# Patient Record
Sex: Male | Born: 1960 | Race: Black or African American | Hispanic: No
Health system: Southern US, Community
[De-identification: ages and names within clinical notes are randomized; demographics above are authoritative.]

## PROBLEM LIST (undated history)

## (undated) DIAGNOSIS — I509 Heart failure, unspecified: Secondary | ICD-10-CM

## (undated) DIAGNOSIS — I10 Essential (primary) hypertension: Secondary | ICD-10-CM

## (undated) DIAGNOSIS — I219 Acute myocardial infarction, unspecified: Secondary | ICD-10-CM

## (undated) DIAGNOSIS — I2510 Atherosclerotic heart disease of native coronary artery without angina pectoris: Secondary | ICD-10-CM

---

## 2007-10-22 ENCOUNTER — Other Ambulatory Visit: Payer: Self-pay

## 2007-10-22 ENCOUNTER — Emergency Department: Payer: Self-pay | Admitting: Unknown Physician Specialty

## 2009-03-28 ENCOUNTER — Emergency Department: Payer: Self-pay | Admitting: Emergency Medicine

## 2010-02-25 ENCOUNTER — Emergency Department: Payer: Self-pay | Admitting: Unknown Physician Specialty

## 2014-01-13 ENCOUNTER — Emergency Department: Payer: Self-pay | Admitting: Emergency Medicine

## 2016-06-02 ENCOUNTER — Encounter: Payer: Self-pay | Admitting: Emergency Medicine

## 2016-06-02 ENCOUNTER — Emergency Department
Admission: EM | Admit: 2016-06-02 | Discharge: 2016-06-02 | Disposition: A | Discharge status: Home / Self Care | Payer: Self-pay | Attending: Emergency Medicine | Admitting: Emergency Medicine

## 2016-06-02 DIAGNOSIS — I10 Essential (primary) hypertension: Secondary | ICD-10-CM | POA: Insufficient documentation

## 2016-06-02 DIAGNOSIS — R51 Headache: Secondary | ICD-10-CM

## 2016-06-02 DIAGNOSIS — F17210 Nicotine dependence, cigarettes, uncomplicated: Secondary | ICD-10-CM | POA: Insufficient documentation

## 2016-06-02 DIAGNOSIS — R519 Headache, unspecified: Secondary | ICD-10-CM

## 2016-06-02 MED ORDER — HYDROCHLOROTHIAZIDE 12.5 MG PO CAPS: 12.5000 mg | ORAL_CAPSULE | Freq: Every day | ORAL | 0 refills | Status: DC

## 2016-06-02 NOTE — ED Provider Notes (Signed)
ED ECG REPORT I, [name], the attending physician, personally viewed and interpreted this ECG.  Sinus bradycardia, rate of 53, normal intervals, normal axis, no ST elevations or depressions.   [name], MD 06/02/16 [phone]

## 2016-06-02 NOTE — ED Provider Notes (Signed)
[facility] Emergency Department Provider Note  ____________________________________________  Time seen: Approximately 1:22 PM  I have reviewed the triage vital signs and the nursing notes.   HISTORY  Chief Complaint Headache    HPI Matthew Atkinson is a 55 y.o. male , NAD, presents to the emergency department via EMS for evaluation of headaches and hypertension. Patient states over the last 3 days he has had intermittent temporal headaches. States he has a family history of hypertension but his mother and father therefore he purchased a wrist blood pressure monitor and noted that his blood pressure was elevated. He also checked his blood pressure at a local [employer] blood pressure kiosk in it was also elevated. States he has overall been healthy throughout his life. Has not seen a medical provider for any physicals in the last 4 years. 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 Denies any visual changes, slurred speech or changes in gait. States that the headache is not the worst headache of his life. Headache was not a thunderclap presentation nor has awoken from sleep. Denies any sinus pressure or ear pressure. Has not had any chest pain, palpitations, shortness of breath, abdominal pain, nausea, vomiting, diaphoresis. Denies any numbness, weakness, tingling.   History reviewed. No pertinent past medical history.  There are no active problems to display for this patient.   History reviewed. No pertinent surgical history.  Prior to Admission medications   Medication Sig Start Date End Date Taking? Authorizing Provider  hydrochlorothiazide (MICROZIDE) 12.5 MG capsule Take 1 capsule (12.5 mg total) by mouth daily. 06/02/16 07/02/16  Jami L Hagler, PA-C    Allergies Patient has no known allergies.  History reviewed. No pertinent family history.  Social History Social  History  Substance Use Topics  . Smoking status: Current Every Day Smoker    Packs/day: 0.50    Types: Cigarettes  . Smokeless tobacco: Never Used  . Alcohol use Yes     Comment: occasionally     Review of Systems  Constitutional: No Fatigue Eyes: No visual changes.  ENT: No Sinus pressure or ear pressure. Cardiovascular: No chest pain. Respiratory: No shortness of breath. No wheezing.  Gastrointestinal: No abdominal pain.  No nausea, vomiting.  No diarrhea.   Musculoskeletal: Negative for General myalgias.  Skin: Negative for rash. Neurological: Positive for headaches, but no focal weakness or numbness. No tingling. No changes in speech or gait. 10-point ROS otherwise negative.  ____________________________________________   PHYSICAL EXAM:  VITAL SIGNS: ED Triage Vitals  Enc Vitals Group     BP 06/02/16 1237 (!) 153/100     Pulse Rate 06/02/16 1237 71     Resp 06/02/16 1237 20     Temp 06/02/16 1237 98 F (36.7 C)     Temp Source 06/02/16 1237 Oral     SpO2 06/02/16 1237 99 %     Weight 06/02/16 1237 212 lb (96.2 kg)     Height 06/02/16 1237 5\' 11"  (1.803 m)     Head Circumference --      Peak Flow --      Pain Score 06/02/16 1238 5     Pain Loc --      Pain Edu? --      Excl. in GC? --      Constitutional: Alert and oriented. Well appearing and in no acute distress. Eyes: Conjunctivae are normal. PERRLA. EOMI without pain.  Head: Atraumatic. ENT:      Ears: No discharge from bilateral ears.      Nose: No congestion/rhinnorhea.      Mouth/Throat: Mucous membranes are moist.  Neck: Supple with full range of motion. No stridor. No carotid bruits. Hematological/Lymphatic/Immunilogical: No cervical lymphadenopathy. Cardiovascular: Normal rate, regular rhythm. Normal S1 and S2.  No murmurs, rubs, gallops. Good peripheral circulation. Respiratory: Normal respiratory effort without tachypnea or retractions. Lungs CTAB with breath sounds noted in all lung fields.  No wheeze, rhonchi, rales Musculoskeletal: No lower extremity tenderness nor edema.  No joint effusions. Neurologic:  Normal speech and language. No gross focal neurologic deficits are appreciated. Cranial nerves III through XII grossly intact. Normal gait and posture. Skin:  Skin is warm, dry and intact. No rash noted. Psychiatric: Mood and affect are normal. Speech and behavior are normal. Patient exhibits appropriate insight and judgement.   ____________________________________________   LABS  None ____________________________________________  EKG  EKG revealed sinus bradycardia with a rate of 53 bpm. No acute changes or evidence of STEMI. EKG also reviewed by Dr. Nita Sicklearolina Veronese. ____________________________________________  RADIOLOGY  None ____________________________________________    PROCEDURES  Procedure(s) performed: None   Procedures   Medications - No data to display   ____________________________________________   INITIAL IMPRESSION / ASSESSMENT AND PLAN / ED COURSE  Pertinent labs & imaging results that were available during my care of the patient were reviewed by me and considered in my medical decision making (see chart for details).  Clinical Course     Patient's diagnosis is consistent with Unspecified hypertension with acute non-intractable headache. Patient will be discharged home with prescriptions for hydrochlorothiazide to take as directed. Patient may also take over-the-counter Tylenol and see if her headache. Patient is to follow up with one of the local outpatient community clinics as listed in his discharge instructions for further evaluation and management of hypertension. Patient is given ED precautions to return to the ED for any worsening or new symptoms.    ____________________________________________  FINAL CLINICAL IMPRESSION(S) / ED DIAGNOSES  Final diagnoses:  Hypertension, unspecified type  Acute nonintractable headache,  unspecified headache type      NEW MEDICATIONS STARTED DURING THIS VISIT:  Discharge Medication List as of 06/02/2016  1:57 PM    START taking these medications   Details  hydrochlorothiazide (MICROZIDE) 12.5 MG capsule Take 1 capsule (12.5 mg total) by mouth daily., Starting Wed 06/02/2016, Until Fri 07/02/2016, Print             Ernestene KielJami L BelknapHagler, PA-C 06/02/16 1513    Myrna Blazeravid Matthew Schaevitz, MD 06/02/16 [phone]

## 2016-06-02 NOTE — ED Notes (Signed)
States headache for several days, states he checked his BP at the grocery store and noticed it was high, denies any hx of HTN, pt awake and alert in no acute distress

## 2016-06-02 NOTE — ED Triage Notes (Signed)
Pt presents to ED via [facility] c/o nagging 5/10 frontal headache radiating to bil temples for "a few days." Denies any medical history, denies taking any medications. No palliative measures taken.

## 2018-05-06 ENCOUNTER — Emergency Department
Admission: EM | Admit: 2018-05-06 | Discharge: 2018-05-06 | Disposition: A | Discharge status: Home / Self Care | Payer: Self-pay | Attending: Emergency Medicine | Admitting: Emergency Medicine

## 2018-05-06 ENCOUNTER — Other Ambulatory Visit: Payer: Self-pay

## 2018-05-06 ENCOUNTER — Emergency Department: Payer: Self-pay

## 2018-05-06 DIAGNOSIS — M1711 Unilateral primary osteoarthritis, right knee: Secondary | ICD-10-CM | POA: Insufficient documentation

## 2018-05-06 DIAGNOSIS — M25461 Effusion, right knee: Secondary | ICD-10-CM | POA: Insufficient documentation

## 2018-05-06 DIAGNOSIS — F17210 Nicotine dependence, cigarettes, uncomplicated: Secondary | ICD-10-CM | POA: Insufficient documentation

## 2018-05-06 DIAGNOSIS — M25561 Pain in right knee: Secondary | ICD-10-CM

## 2018-05-06 MED ORDER — PREDNISONE 20 MG PO TABS
60.0000 mg | ORAL_TABLET | Freq: Once | ORAL | Status: AC
  Administered 2018-05-06: 60 mg via ORAL
  Filled 2018-05-06: qty 3

## 2018-05-06 MED ORDER — PREDNISONE 10 MG PO TABS: 10.0000 mg | ORAL_TABLET | Freq: Every day | ORAL | 0 refills | Status: DC

## 2018-05-06 MED ORDER — LIDOCAINE HCL (PF) 1 % IJ SOLN: 2.0000 mL | Freq: Once | INTRAMUSCULAR | Status: DC

## 2018-05-06 NOTE — ED Triage Notes (Signed)
Patient reports s/s began yesterday.  Reports right knee pain and swelling, no known obvious injury.  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

## 2018-05-06 NOTE — ED Provider Notes (Signed)
[facility] EMERGENCY DEPARTMENT Provider Note   CSN: [id number] Arrival date & time: 05/06/18  2037     History   Chief Complaint Chief Complaint  Patient presents with  . Knee Pain    HPI Matthew Atkinson is a 57 y.o. male presents to the emergency department for evaluation of right knee pain that began yesterday.  He denies any trauma or injury.  He describes tightness and swelling throughout the knee mostly along the suprapatellar region.  He has a hard time bending the knee.  He is ambulatory with no assistive devices but has a slight limp.  He denies any calf pain, fevers, warmth, redness or edema.  He has had some ibuprofen earlier today with mild relief.  Patient states he works 3 jobs that require a lot of standing and walking.  No catching clicking or locking.  Occasionally knee will buckle and give way.  HPI  No past medical history on file.  There are no active problems to display for this patient.   No past surgical history on file.      Home Medications    Prior to Admission medications   Medication Sig Start Date End Date Taking? Authorizing Provider  hydrochlorothiazide (MICROZIDE) 12.5 MG capsule Take 1 capsule (12.5 mg total) by mouth daily. 06/02/16 07/02/16  Hagler, Jami L, PA-C  predniSONE (DELTASONE) 10 MG tablet Take 1 tablet (10 mg total) by mouth daily. 6,5,4,3,2,1 six day taper 05/06/18   Evon Slack, PA-C    Family History No family history on file.  Social History Social History   Tobacco Use  . Smoking status: Current Every Day Smoker    Packs/day: 0.50    Types: Cigarettes  . Smokeless tobacco: Never Used  Substance Use Topics  . Alcohol use: Yes    Comment: occasionally  . Drug use: No     Allergies   Patient has no known allergies.   Review of Systems Review of Systems  Constitutional: Negative for fever.  Respiratory: Negative for shortness of breath.   Cardiovascular: Negative for chest pain  and leg swelling.  Musculoskeletal: Positive for arthralgias, gait problem and joint swelling. Negative for myalgias and neck pain.  Skin: Negative for rash and wound.  Neurological: Negative for dizziness, light-headedness and headaches.     Physical Exam Updated Vital Signs BP (!) 155/87 (BP Location: Left Arm)   Pulse 82   Temp 98.4 F (36.9 C) (Oral)   Resp 18   Ht 5\' 11"  (1.803 m)   Wt 106.6 kg   SpO2 98%   BMI 32.78 kg/m   Physical Exam  Constitutional: He is oriented to person, place, and time. He appears well-developed and well-nourished.  HENT:  Head: Normocephalic and atraumatic.  Eyes: Conjunctivae are normal.  Neck: Normal range of motion.  Cardiovascular: Normal rate.  Pulmonary/Chest: Effort normal. No respiratory distress.  Musculoskeletal:  Right lower extremity with normal range of motion of the hip and ankle.  Limited active flexion of the knee to 85 degrees.  He is able to straight leg raise.  No instability with valgus varus stress testing.  No swelling or edema throughout the calf ankle or foot.  Patient is tender palpation along the suprapatellar region.  There is fluctuance noted and consistent with joint effusion.  Patient ambulates with a slight limp.  Neurological: He is alert and oriented to person, place, and time.  Skin: Skin is warm. No rash noted.  Psychiatric: He has a normal  mood and affect. His behavior is normal. Thought content normal.     ED Treatments / Results  Labs (all labs ordered are listed, but only abnormal results are displayed) Labs Reviewed - No data to display  EKG None  Radiology Dg Knee Complete 4 Views Right  Result Date: 05/06/2018 CLINICAL DATA:  Pain and swelling for 2 days.  No known injury. EXAM: RIGHT KNEE - COMPLETE 4+ VIEW COMPARISON:  None. FINDINGS: Mild degenerative changes in the right knee with medial compartment narrowing and small osteophyte formation. No evidence of acute fracture or dislocation. No  focal bone lesion or bone destruction. Bone cortex appears intact. Small right knee effusion is present. IMPRESSION: Mild degenerative changes in the right knee. Small effusion. No acute fracture or dislocation identified. Electronically Signed   By: Burman NievesWilliam  Stevens M.D.   On: 05/06/2018 22:44    Procedures .Joint Aspiration/Arthrocentesis Date/Time: 05/06/2018 10:38 PM Performed by: Evon SlackGaines, Nayely Dingus C, PA-C Authorized by: Evon SlackGaines, Tessia Kassin C, PA-C   Consent:    Consent obtained:  Verbal   Consent given by:  Patient   Risks discussed:  Incomplete drainage, infection and pain   Alternatives discussed:  No treatment Location:    Location:  Knee   Knee:  R knee Anesthesia (see MAR for exact dosages):    Anesthesia method:  Local infiltration   Local anesthetic:  Lidocaine 1% w/o epi Procedure details:    Needle gauge:  18 G   Ultrasound guidance: no     Approach:  Superior   Aspirate amount:  50 cc of normal clear yellow synovial fluid   Aspirate characteristics:  Clear and yellow   Steroid injected: no     Specimen collected: no   Post-procedure details:    Dressing:  Adhesive bandage   Patient tolerance of procedure:  Tolerated well, no immediate complications Comments:     Ace wrap applied.   (including critical care time)  Medications Ordered in ED Medications  lidocaine (PF) (XYLOCAINE) 1 % injection 2 mL (has no administration in time range)  predniSONE (DELTASONE) tablet 60 mg (60 mg Oral Given 05/06/18 2248)     Initial Impression / Assessment and Plan / ED Course  I have reviewed the triage vital signs and the nursing notes.  Pertinent labs & imaging results that were available during my care of the patient were reviewed by me and considered in my medical decision making (see chart for details).     57 year old male with right knee joint effusion.  Nonweightbearing x-rays of the right knee reviewed by me today show no evidence of acute bony abnormality.  Patient  has mild medial compartment osteoarthritis.  Patient shows no signs of DVT today.  Joint fluid showed no signs concerning for gout or septic joint.  Patient saw significant improvement with pain and knee range of motion after removing 50 cc of normal clear yellow fluid.  Patient placed on 6-day steroid taper.  He will follow with orthopedics if no improvement.  He understands signs and symptoms return to ED for such as any increasing pain or swelling.  Final Clinical Impressions(s) / ED Diagnoses   Final diagnoses:  Effusion of right knee  Acute pain of right knee  Primary osteoarthritis of right knee    ED Discharge Orders         Ordered    predniSONE (DELTASONE) 10 MG tablet  Daily     05/06/18 2234           Floyce StakesGaines,  Jesse Sans, PA-C 05/06/18 2257    Jene Every, MD 05/06/18 2308

## 2018-05-06 NOTE — Discharge Instructions (Signed)
Please rest ice and elevate the right knee.  Use Ace wrap for compression as needed.  Take prednisone as prescribed.  If any increasing pain or swelling throughout the right leg return to the emergency department.  Follow-up with orthopedics if no improvement in the next 2 to 3 days.

## 2018-05-06 NOTE — ED Notes (Signed)
Pain and  Swelling  r  Knee Symptoms  Started  Yesterday  No known  Injury  pain on  Weight bearing

## 2019-05-20 ENCOUNTER — Ambulatory Visit
Admission: EM | Admit: 2019-05-20 | Discharge: 2019-05-20 | Disposition: A | Discharge status: Home / Self Care | Payer: Self-pay | Attending: Urgent Care | Admitting: Urgent Care

## 2019-05-20 ENCOUNTER — Other Ambulatory Visit: Payer: Self-pay

## 2019-05-20 DIAGNOSIS — H66001 Acute suppurative otitis media without spontaneous rupture of ear drum, right ear: Secondary | ICD-10-CM

## 2019-05-20 DIAGNOSIS — J029 Acute pharyngitis, unspecified: Secondary | ICD-10-CM

## 2019-05-20 LAB — RAPID STREP SCREEN (MED CTR MEBANE ONLY): Streptococcus, Group A Screen (Direct): NEGATIVE

## 2019-05-20 MED ORDER — DEXAMETHASONE SODIUM PHOSPHATE 10 MG/ML IJ SOLN
10.0000 mg | Freq: Once | INTRAMUSCULAR | Status: AC
  Administered 2019-05-20: 10 mg via INTRAMUSCULAR

## 2019-05-20 MED ORDER — PREDNISONE 10 MG (21) PO TBPK: ORAL_TABLET | Freq: Every day | ORAL | 0 refills | Status: DC

## 2019-05-20 MED ORDER — AMOXICILLIN-POT CLAVULANATE 875-125 MG PO TABS: 1.0000 | ORAL_TABLET | Freq: Two times a day (BID) | ORAL | 0 refills | Status: AC

## 2019-05-20 NOTE — Discharge Instructions (Addendum)
It was very nice seeing you today in clinic. Thank you for entrusting me with your care.   Rest and Stay HYDRATED. Water and electrolyte containing beverages (Gatorade, Pedialyte) are best to prevent dehydration and electrolyte abnormalities.  Take medications as prescribed. May use Tylenol and/or Ibuprofen as needed for pain/fever. Continue Chloraseptic and throat lozenges. Salt water gargles may also help with your throat pain.   Make arrangements to follow up with your regular doctor in 1 week for re-evaluation if not improving. If your symptoms/condition worsens, please seek follow up care either here or in the ER. Please remember, our [place] providers are "right here with you" when you need [place].   Again, it was my pleasure to take care of you today. Thank you for choosing our clinic. I hope that you start to feel better quickly.   Honor Loh, MSN, APRN, FNP-C, CEN Advanced Practice Provider [facility]

## 2019-05-20 NOTE — ED Provider Notes (Addendum)
[place], [place]   Name: Matthew Atkinson DOB: 14-Jul-1960 MRN: [id number] CSN: [id number] [facility]  Arrival date and time:  05/20/19 [id number]  Chief Complaint:  Sore Throat and Otalgia   NOTE: Prior to seeing the patient today, I have reviewed the triage nursing documentation and vital signs. Clinical staff has updated patient's PMH/PSHx, current medication list, and drug allergies/intolerances to ensure comprehensive history available to assist in medical decision making.   History:   HPI: Matthew Atkinson is a 58 y.o. male who presents today with complaints of sore throat and pain in his RIGHT ear for the last 4-5 days. Patient denies any associated fevers. He feels like this throat is swollen. Patient denies any dysphagia; able to handle oral secretions and thin liquids. No shortness of breath, wheezing, or stridor reported. Patient has been using Chloraseptic and throat lozenges to help with his pain, neither of which have been effective. Patient reports that he has not been eating much because of the pain in his throat. He is making efforts to remain hydrated. Additionally, patient with complaints of pain in his RIGHT ear. He denies associated any otorrhea. Patient denies other recent upper respiratory symptoms; no cough, congestion, rhinorrhea, or sneezing. He denies forceful nose blowing. He advises that his ability to hear from the RIGHT ear has acutely changed with the onset of the pain; describes hearing as being muffled.   History reviewed. No pertinent past medical history.  History reviewed. No pertinent surgical history.  Family History  Problem Relation Age of Onset   Diabetes Father     Social History   Tobacco Use   Smoking status: Current Every Day Smoker    Packs/day: 0.50    Types: Cigarettes   Smokeless tobacco: Never Used  Substance Use Topics   Alcohol use: Yes    Comment: occasionally   Drug use: No    There are no active  problems to display for this patient.   Home Medications:    No outpatient medications have been marked as taking for the 05/20/19 encounter [facility](Hospital Encounter).    Allergies:   Patient has no known allergies.  Review of Systems (ROS): Review of Systems  Constitutional: Negative for fatigue and fever.  HENT: Positive for ear pain and sore throat. Negative for congestion, drooling, postnasal drip, rhinorrhea, sinus pressure, sinus pain, sneezing and trouble swallowing.   Eyes: Negative for pain, discharge and redness.  Respiratory: Negative for cough, chest tightness and shortness of breath.   Cardiovascular: Negative for chest pain and palpitations.  Gastrointestinal: Negative for abdominal pain, diarrhea, nausea and vomiting.  Musculoskeletal: Negative for arthralgias, back pain, myalgias and neck pain.  Skin: Negative for color change, pallor and rash.  Neurological: Negative for dizziness, syncope, weakness and headaches.  Hematological: Negative for adenopathy.     Vital Signs: Today's Vitals   05/20/19 0920 05/20/19 0922 05/20/19 1003  BP:  (!) 165/98   Pulse:  85   Resp:  18   Temp:  98.9 F (37.2 C)   TempSrc:  Oral   SpO2:  99%   Weight: 226 lb (102.5 kg)    Height: 5\' 11"  (1.803 m)    PainSc: 8   8     Physical Exam: Physical Exam  Constitutional: He is oriented to person, place, and time and well-developed, well-nourished, and in no distress.  HENT:  Head: Normocephalic and atraumatic.  Right Ear: There is swelling and tenderness. No drainage. Tympanic membrane is erythematous  and bulging. A middle ear effusion (suppurative) is present. Decreased hearing (muffled) is noted.  Left Ear: Tympanic membrane normal.  Nose: Nose normal.  Mouth/Throat: Uvula is midline and mucous membranes are normal. Posterior oropharyngeal edema and posterior oropharyngeal erythema present. No oropharyngeal exudate.  Eyes: Pupils are equal, round, and reactive to light.  Conjunctivae are normal.  Neck: Normal range of motion. Neck supple.  Cardiovascular: Normal rate, regular rhythm, normal heart sounds and intact distal pulses.  Pulmonary/Chest: Effort normal and breath sounds normal.  Musculoskeletal: Normal range of motion.  Lymphadenopathy:       Head (right side): Submandibular and posterior auricular adenopathy present.  Neurological: He is alert and oriented to person, place, and time. Gait normal.  Skin: Skin is warm and dry. No rash noted. He is not diaphoretic.  Psychiatric: Mood, memory, affect and judgment normal.  Nursing note and vitals reviewed.   Urgent Care Treatments / Results:   LABS: PLEASE NOTE: all labs that were ordered this encounter are listed, however only abnormal results are displayed. Labs Reviewed  RAPID STREP SCREEN ([facility] [place] ONLY)  CULTURE, GROUP A STREP [facility])    EKG: -None  RADIOLOGY: No results found.  PROCEDURES: Procedures  MEDICATIONS RECEIVED THIS VISIT: Medications  dexamethasone (DECADRON) injection 10 mg (10 mg Intramuscular Given 05/20/19 1001)    PERTINENT CLINICAL COURSE NOTES/UPDATES:   Initial Impression / Assessment and Plan / Urgent Care Course:  Pertinent labs & imaging results that were available during my care of the patient were personally reviewed by me and considered in my medical decision making (see lab/imaging section of note for values and interpretations).  Matthew Atkinson is a 58 y.o. male who presents to [facility] today with complaints of Sore Throat and Otalgia   Patient is well appearing overall in clinic today. He does not appear to be in any acute distress. Presenting symptoms (see HPI) and exam as documented above. Rapid streptococcal throat swab (-); reflex culture sent. He did not wish to be tested for SARS-CoV-2 (novel coronavirus) today while here. Symptoms consistent with an AOME in the RIGHT ear and pharyngitis. Throat is swollen and very painful in  clinic. He reports decreased PO intake secondary. Patient with financial restrictions that will prevent him from getting any prescriptions until he gets he check tomorrow. Dexamethasone 10 mg IM given in clinic to help with pain and throat swelling. Will pursue treatment with a 10 day course of amoxicillin-clavulanate and a systemic steroid taper. Discussed supportive care measures at home during acute phase of illness. Patient to rest as much as possible. He was encouraged to ensure adequate hydration (water and ORS) to prevent dehydration and electrolyte derangements. Patient may use APAP and/or IBU on an as needed basis for pain/fever. Encouraged warm salt water gargles and continues use of Chloraseptic spray and throat lozenges to help soothe his throat.     Current clinical condition warrants patient being out of work in order to recover from his current injury/illness. He was provided with the appropriate documentation to provide to his place of employment that will allow for him to RTW on 05/23/2019 with no restrictions.   Discussed follow up with primary care physician in 1 week for re-evaluation. I have reviewed the follow up and strict return precautions for any new or worsening symptoms. Patient is aware of symptoms that would be deemed urgent/emergent, and would thus require further evaluation either here or in the emergency department. At the time of discharge, he  verbalized understanding and consent with the discharge plan as it was reviewed with him. All questions were fielded by provider and/or clinic staff prior to patient discharge.    Final Clinical Impressions / Urgent Care Diagnoses:   Final diagnoses:  Non-recurrent acute suppurative otitis media of right ear without spontaneous rupture of tympanic membrane  Acute pharyngitis, unspecified etiology    New Prescriptions:  [place] Controlled Substance Registry consulted? Not Applicable  Meds ordered this encounter  Medications    predniSONE (STERAPRED UNI-PAK 21 TAB) 10 MG (21) TBPK tablet    Sig: Take by mouth daily. 60 mg x 1 day, 50 mg x 1 day, 40 mg x 1 day, 30 mg x 1 day, 20 mg x 1 day, 10 mg x 1 day    Dispense:  21 tablet    Refill:  0   amoxicillin-clavulanate (AUGMENTIN) 875-125 MG tablet    Sig: Take 1 tablet by mouth 2 (two) times daily for 10 days.    Dispense:  20 tablet    Refill:  0   dexamethasone (DECADRON) injection 10 mg    Recommended Follow up Care:  Patient encouraged to follow up with the following provider within the specified time frame, or sooner as dictated by the severity of his symptoms. As always, he was instructed that for any urgent/emergent care needs, he should seek care either here or in the emergency department for more immediate evaluation.  Follow-up Information    [facility] In 1 week.   Specialty: General Practice Why: General reassessment of symptoms if not improving Contact information: [address] [place] [address] [phone]         NOTE: This note was prepared using Dragon dictation software along with smaller phrase technology. Despite my best ability to proofread, there is the potential that transcriptional errors may still occur from this process, and are completely unintentional.    Verlee Monte, NP 05/20/19 2350

## 2019-05-20 NOTE — ED Triage Notes (Signed)
Patient complains of severe sore throat x 4-5 days. Patient states that he has been unable to swallow without pain and swelling and in his throat. Patient states that he has pain from right ear as well.

## 2019-05-23 LAB — CULTURE, GROUP A STREP (THRC)

## 2019-09-20 ENCOUNTER — Other Ambulatory Visit: Payer: Self-pay

## 2019-09-20 ENCOUNTER — Ambulatory Visit: Payer: Self-pay | Attending: Internal Medicine

## 2019-09-20 DIAGNOSIS — Z23 Encounter for immunization: Secondary | ICD-10-CM

## 2019-09-20 NOTE — Progress Notes (Signed)
   Covid-19 Vaccination Clinic  Name:  Nadia Torr    MRN: [id number] DOB: 14-Jul-1960  09/20/2019  Mr. Surratt was observed post Covid-19 immunization for 15 minutes without incident. He was provided with Vaccine Information Sheet and instruction to access the V-Safe system.   Mr. Almeda was instructed to call 911 with any severe reactions post vaccine: Marland Kitchen Difficulty breathing  . Swelling of face and throat  . A fast heartbeat  . A bad rash all over body  . Dizziness and weakness   Immunizations Administered    Name Date Dose VIS Date Route   Pfizer COVID-19 Vaccine 09/20/2019  8:52 AM 0.3 mL 05/25/2019 Intramuscular   Manufacturer: [employer], [employer]   Lot: [id number]   NDC: [address]-7

## 2019-10-16 ENCOUNTER — Ambulatory Visit: Payer: Self-pay | Attending: Internal Medicine

## 2019-10-16 DIAGNOSIS — Z23 Encounter for immunization: Secondary | ICD-10-CM

## 2019-10-16 NOTE — Progress Notes (Signed)
   Covid-19 Vaccination Clinic  Name:  Cederick Broadnax    MRN: [id number] DOB: 08-01-1960  10/16/2019  Mr. Henson was observed post Covid-19 immunization for 15 minutes without incident. He was provided with Vaccine Information Sheet and instruction to access the V-Safe system.   Mr. Heal was instructed to call 911 with any severe reactions post vaccine: Marland Kitchen Difficulty breathing  . Swelling of face and throat  . A fast heartbeat  . A bad rash all over body  . Dizziness and weakness   Immunizations Administered    Name Date Dose VIS Date Route   Pfizer COVID-19 Vaccine 10/16/2019  9:06 AM 0.3 mL 08/08/2018 Intramuscular   Manufacturer: [employer], [employer]   Lot: [id number]   NDC: [address]-5

## 2019-10-22 NOTE — Progress Notes (Deleted)
Patient is a 59 year old male who presents today new to the practice He [facility]  He was recently evaluated 09/12/2019 by internal medicine at [facility] clinic for bilateral hand swelling after taking his lisinopril and simvastatin, with the simvastatin starting approximately 1 month prior, and stopped a few days after due to him experiencing itching in the forearms.  He then was started on lisinopril 1 to 2 weeks later, and again developed itching in the forearms and swelling of the hands.  He subsequently stopped that 2 days before that evaluation on 09/12/2019.  His blood pressure on that visit was 126/80.  He had a chest x-ray done at that visit which was okay, and low-dose hydrochlorothiazide-12.5 mg daily was initiated.  (It was noted he had been on that in the past and had no side effects to it previously).  A BMP done at that visit was also unremarkable.  HTN As noted above, probable reaction to lisinopril noted  Hyperlipidemia As noted above, stop the simvastatin due to side effect concerns  H/o pancreatitis -required an emergency room visit in December 2018   Patient had a colonoscopy on 10/28/2017, felt to be at high risk due to a brother who was diagnosed with colon cancer before the age of 29. - Three 1 to 2 mm polyps in the cecum, removed with a cold            biopsy forceps. Resected and retrieved.           - One 2 mm polyp at the splenic flexure, removed with a            cold biopsy forceps. Resected and retrieved.           - Internal hemorrhoids. -Repeat in 5 years was recommended.  Tob - current every day smoker

## 2019-10-23 ENCOUNTER — Ambulatory Visit: Payer: Self-pay | Admitting: Internal Medicine

## 2019-11-01 ENCOUNTER — Other Ambulatory Visit: Payer: Self-pay

## 2019-11-01 ENCOUNTER — Encounter: Payer: Self-pay | Admitting: Emergency Medicine

## 2019-11-01 ENCOUNTER — Encounter
Admission: EM | Disposition: A | Discharge status: Home / Self Care | Payer: Self-pay | Source: Home / Self Care | Attending: Internal Medicine

## 2019-11-01 ENCOUNTER — Inpatient Hospital Stay
Admission: EM | Admit: 2019-11-01 | Discharge: 2019-11-03 | DRG: 247 | Disposition: A | Discharge status: Home / Self Care | Payer: Self-pay | Attending: Internal Medicine | Admitting: Internal Medicine

## 2019-11-01 DIAGNOSIS — Z720 Tobacco use: Secondary | ICD-10-CM

## 2019-11-01 DIAGNOSIS — N189 Chronic kidney disease, unspecified: Secondary | ICD-10-CM | POA: Diagnosis present

## 2019-11-01 DIAGNOSIS — R252 Cramp and spasm: Secondary | ICD-10-CM

## 2019-11-01 DIAGNOSIS — I213 ST elevation (STEMI) myocardial infarction of unspecified site: Secondary | ICD-10-CM | POA: Diagnosis present

## 2019-11-01 DIAGNOSIS — Z20822 Contact with and (suspected) exposure to covid-19: Secondary | ICD-10-CM | POA: Diagnosis present

## 2019-11-01 DIAGNOSIS — F17210 Nicotine dependence, cigarettes, uncomplicated: Secondary | ICD-10-CM | POA: Diagnosis present

## 2019-11-01 DIAGNOSIS — I2129 ST elevation (STEMI) myocardial infarction involving other sites: Principal | ICD-10-CM

## 2019-11-01 DIAGNOSIS — R739 Hyperglycemia, unspecified: Secondary | ICD-10-CM | POA: Diagnosis not present

## 2019-11-01 DIAGNOSIS — N179 Acute kidney failure, unspecified: Secondary | ICD-10-CM | POA: Diagnosis present

## 2019-11-01 DIAGNOSIS — Z833 Family history of diabetes mellitus: Secondary | ICD-10-CM

## 2019-11-01 DIAGNOSIS — I160 Hypertensive urgency: Secondary | ICD-10-CM

## 2019-11-01 DIAGNOSIS — I2510 Atherosclerotic heart disease of native coronary artery without angina pectoris: Secondary | ICD-10-CM | POA: Diagnosis present

## 2019-11-01 DIAGNOSIS — Z79899 Other long term (current) drug therapy: Secondary | ICD-10-CM

## 2019-11-01 DIAGNOSIS — I1310 Hypertensive heart and chronic kidney disease without heart failure, with stage 1 through stage 4 chronic kidney disease, or unspecified chronic kidney disease: Secondary | ICD-10-CM | POA: Diagnosis present

## 2019-11-01 DIAGNOSIS — E663 Overweight: Secondary | ICD-10-CM | POA: Diagnosis present

## 2019-11-01 DIAGNOSIS — Z6831 Body mass index (BMI) 31.0-31.9, adult: Secondary | ICD-10-CM

## 2019-11-01 HISTORY — PX: LEFT HEART CATH AND CORONARY ANGIOGRAPHY: CATH118249

## 2019-11-01 HISTORY — PX: CORONARY/GRAFT ACUTE MI REVASCULARIZATION: CATH118305

## 2019-11-01 HISTORY — PX: CORONARY STENT INTERVENTION: CATH118234

## 2019-11-01 LAB — COMPREHENSIVE METABOLIC PANEL
ALT: 19 U/L (ref 0–44)
AST: 20 U/L (ref 15–41)
Albumin: 3.6 g/dL (ref 3.5–5.0)
Alkaline Phosphatase: 42 U/L (ref 38–126)
Anion gap: 13 (ref 5–15)
BUN: 19 mg/dL (ref 8–23)
CO2: 20 mmol/L — ABNORMAL LOW (ref 22–32)
Calcium: 8.8 mg/dL — ABNORMAL LOW (ref 8.9–10.3)
Chloride: 107 mmol/L (ref 98–111)
Creatinine, Ser: 1.96 mg/dL — ABNORMAL HIGH (ref 0.61–1.24)
GFR calc Af Amer: 42 mL/min — ABNORMAL LOW (ref >60)
GFR calc non Af Amer: 36 mL/min — ABNORMAL LOW (ref >60)
Glucose, Bld: 135 mg/dL — ABNORMAL HIGH (ref 70–99)
Potassium: 4.5 mmol/L (ref 3.5–5.1)
Sodium: 140 mmol/L (ref 135–145)
Total Bilirubin: 0.9 mg/dL (ref 0.3–1.2)
Total Protein: 6.5 g/dL (ref 6.5–8.1)

## 2019-11-01 LAB — CBC WITH DIFFERENTIAL/PLATELET
Abs Immature Granulocytes: 0.04 10*3/uL (ref 0.00–0.07)
Basophils Absolute: 0 10*3/uL (ref 0.0–0.1)
Basophils Relative: 0 %
Eosinophils Absolute: 0.1 10*3/uL (ref 0.0–0.5)
Eosinophils Relative: 1 %
HCT: 52.9 % — ABNORMAL HIGH (ref 39.0–52.0)
Hemoglobin: 18.1 g/dL — ABNORMAL HIGH (ref 13.0–17.0)
Immature Granulocytes: 1 %
Lymphocytes Relative: 28 %
Lymphs Abs: 2.4 10*3/uL (ref 0.7–4.0)
MCH: 30.9 pg (ref 26.0–34.0)
MCHC: 34.2 g/dL (ref 30.0–36.0)
MCV: 90.4 fL (ref 80.0–100.0)
Monocytes Absolute: 0.6 10*3/uL (ref 0.1–1.0)
Monocytes Relative: 7 %
Neutro Abs: 5.6 10*3/uL (ref 1.7–7.7)
Neutrophils Relative %: 63 %
Platelets: 190 10*3/uL (ref 150–400)
RBC: 5.85 MIL/uL — ABNORMAL HIGH (ref 4.22–5.81)
RDW: 12.9 % (ref 11.5–15.5)
WBC: 8.7 10*3/uL (ref 4.0–10.5)
nRBC: 0 % (ref 0.0–0.2)

## 2019-11-01 LAB — TROPONIN I (HIGH SENSITIVITY)
Troponin I (High Sensitivity): 25 ng/L — ABNORMAL HIGH (ref <18)
Troponin I (High Sensitivity): 1020 ng/L (ref <18)

## 2019-11-01 LAB — PROTIME-INR
INR: 1 (ref 0.8–1.2)
Prothrombin Time: 12.6 seconds (ref 11.4–15.2)

## 2019-11-01 LAB — POCT ACTIVATED CLOTTING TIME: Activated Clotting Time: 329 seconds

## 2019-11-01 LAB — SARS CORONAVIRUS 2 BY RT PCR (HOSPITAL ORDER, PERFORMED IN ~~LOC~~ HOSPITAL LAB): SARS Coronavirus 2: NEGATIVE

## 2019-11-01 SURGERY — CORONARY/GRAFT ACUTE MI REVASCULARIZATION
Anesthesia: Moderate Sedation

## 2019-11-01 MED ORDER — SODIUM CHLORIDE 0.9 % IV SOLN: 250.0000 mL | INTRAVENOUS | Status: DC | PRN

## 2019-11-01 MED ORDER — SODIUM CHLORIDE 0.9% FLUSH: 3.0000 mL | INTRAVENOUS | Status: DC | PRN

## 2019-11-01 MED ORDER — TICAGRELOR 90 MG PO TABS
90.0000 mg | ORAL_TABLET | Freq: Two times a day (BID) | ORAL | Status: DC
  Administered 2019-11-02 – 2019-11-03 (×3): 90 mg via ORAL
  Filled 2019-11-01 (×3): qty 1

## 2019-11-01 MED ORDER — HEPARIN SODIUM (PORCINE) 1000 UNIT/ML IJ SOLN
INTRAMUSCULAR | Status: DC | PRN
  Administered 2019-11-01: 8000 [IU] via INTRAVENOUS

## 2019-11-01 MED ORDER — ASPIRIN 81 MG PO CHEW
81.0000 mg | CHEWABLE_TABLET | Freq: Every day | ORAL | Status: DC
  Administered 2019-11-01 – 2019-11-03 (×3): 81 mg via ORAL
  Filled 2019-11-01 (×3): qty 1

## 2019-11-01 MED ORDER — TICAGRELOR 90 MG PO TABS
ORAL_TABLET | ORAL | Status: AC
  Filled 2019-11-01: qty 2

## 2019-11-01 MED ORDER — NITROGLYCERIN 1 MG/10 ML FOR IR/CATH LAB
INTRA_ARTERIAL | Status: AC
  Filled 2019-11-01: qty 10

## 2019-11-01 MED ORDER — METHOCARBAMOL 500 MG PO TABS
500.0000 mg | ORAL_TABLET | Freq: Four times a day (QID) | ORAL | Status: DC | PRN
  Administered 2019-11-02: 500 mg via ORAL
  Filled 2019-11-01 (×2): qty 1

## 2019-11-01 MED ORDER — SODIUM CHLORIDE 0.9% FLUSH
3.0000 mL | Freq: Two times a day (BID) | INTRAVENOUS | Status: DC
  Administered 2019-11-02 – 2019-11-03 (×3): 3 mL via INTRAVENOUS

## 2019-11-01 MED ORDER — TIROFIBAN HCL IV 12.5 MG/250 ML
INTRAVENOUS | Status: AC
  Filled 2019-11-01: qty 250

## 2019-11-01 MED ORDER — VERAPAMIL HCL 2.5 MG/ML IV SOLN
INTRAVENOUS | Status: DC | PRN
  Administered 2019-11-01: 2.5 mg via INTRA_ARTERIAL

## 2019-11-01 MED ORDER — TIROFIBAN (AGGRASTAT) BOLUS VIA INFUSION
INTRAVENOUS | Status: DC | PRN
  Administered 2019-11-01: 2562.5 ug via INTRAVENOUS

## 2019-11-01 MED ORDER — TIROFIBAN HCL IV 12.5 MG/250 ML
INTRAVENOUS | Status: AC | PRN
  Administered 2019-11-01: 0.15 ug/kg/min via INTRAVENOUS

## 2019-11-01 MED ORDER — MAGNESIUM SULFATE IN D5W 1-5 GM/100ML-% IV SOLN
1.0000 g | Freq: Once | INTRAVENOUS | Status: AC
  Administered 2019-11-02: 1 g via INTRAVENOUS
  Filled 2019-11-01: qty 100

## 2019-11-01 MED ORDER — ONDANSETRON HCL 4 MG/2ML IJ SOLN: 4.0000 mg | Freq: Four times a day (QID) | INTRAMUSCULAR | Status: DC | PRN

## 2019-11-01 MED ORDER — METOPROLOL TARTRATE 50 MG PO TABS
25.0000 mg | ORAL_TABLET | Freq: Two times a day (BID) | ORAL | Status: DC
  Administered 2019-11-01 – 2019-11-02 (×2): 25 mg via ORAL
  Filled 2019-11-01 (×2): qty 1

## 2019-11-01 MED ORDER — HEPARIN SODIUM (PORCINE) 1000 UNIT/ML IJ SOLN
INTRAMUSCULAR | Status: DC | PRN
  Administered 2019-11-01: 5000 [IU] via INTRAVENOUS

## 2019-11-01 MED ORDER — IOHEXOL 300 MG/ML  SOLN
INTRAMUSCULAR | Status: DC | PRN
  Administered 2019-11-01: 190 mL

## 2019-11-01 MED ORDER — POLYETHYLENE GLYCOL 3350 17 G PO PACK
17.0000 g | PACK | Freq: Every day | ORAL | Status: DC | PRN
  Administered 2019-11-02: 17 g via ORAL
  Filled 2019-11-01: qty 1

## 2019-11-01 MED ORDER — LISINOPRIL 5 MG PO TABS
5.0000 mg | ORAL_TABLET | Freq: Every day | ORAL | Status: DC
  Administered 2019-11-01 – 2019-11-03 (×3): 5 mg via ORAL
  Filled 2019-11-01: qty 1
  Filled 2019-11-01 (×2): qty 0.5
  Filled 2019-11-01 (×2): qty 1

## 2019-11-01 MED ORDER — SODIUM CHLORIDE 0.9 % WEIGHT BASED INFUSION
1.0000 mL/kg/h | INTRAVENOUS | Status: AC
  Administered 2019-11-01: 1 mL/kg/h via INTRAVENOUS

## 2019-11-01 MED ORDER — ATORVASTATIN CALCIUM 80 MG PO TABS
80.0000 mg | ORAL_TABLET | Freq: Every day | ORAL | Status: DC
  Administered 2019-11-01 – 2019-11-03 (×3): 80 mg via ORAL
  Filled 2019-11-01: qty 1
  Filled 2019-11-01 (×2): qty 4
  Filled 2019-11-01 (×2): qty 1

## 2019-11-01 MED ORDER — ACETAMINOPHEN 325 MG PO TABS: 650.0000 mg | ORAL_TABLET | ORAL | Status: DC | PRN

## 2019-11-01 MED ORDER — TIROFIBAN HCL IV 12.5 MG/250 ML
0.0750 ug/kg/min | INTRAVENOUS | Status: AC
  Administered 2019-11-01: 0.075 ug/kg/min via INTRAVENOUS

## 2019-11-01 MED ORDER — SENNA 8.6 MG PO TABS
2.0000 | ORAL_TABLET | Freq: Every day | ORAL | Status: DC | PRN
  Administered 2019-11-02: 17.2 mg via ORAL
  Filled 2019-11-01: qty 2

## 2019-11-01 MED ORDER — HEPARIN SODIUM (PORCINE) 1000 UNIT/ML IJ SOLN
INTRAMUSCULAR | Status: AC
  Filled 2019-11-01: qty 1

## 2019-11-01 MED ORDER — TICAGRELOR 90 MG PO TABS
ORAL_TABLET | ORAL | Status: DC | PRN
  Administered 2019-11-01: 180 mg via ORAL

## 2019-11-01 MED ORDER — DOCUSATE SODIUM 100 MG PO CAPS
100.0000 mg | ORAL_CAPSULE | Freq: Two times a day (BID) | ORAL | Status: DC
  Administered 2019-11-02 – 2019-11-03 (×3): 100 mg via ORAL
  Filled 2019-11-01 (×4): qty 1

## 2019-11-01 MED ORDER — LABETALOL HCL 5 MG/ML IV SOLN: 10.0000 mg | INTRAVENOUS | Status: AC | PRN

## 2019-11-01 MED ORDER — VERAPAMIL HCL 2.5 MG/ML IV SOLN
INTRAVENOUS | Status: AC
  Filled 2019-11-01: qty 2

## 2019-11-01 MED ORDER — SODIUM CHLORIDE 0.9 % IV SOLN
INTRAVENOUS | Status: AC | PRN
  Administered 2019-11-01: 250 mL via INTRAVENOUS

## 2019-11-01 MED ORDER — CYCLOBENZAPRINE HCL 10 MG PO TABS
10.0000 mg | ORAL_TABLET | Freq: Three times a day (TID) | ORAL | Status: DC | PRN
  Filled 2019-11-01: qty 1

## 2019-11-01 MED ORDER — HYDRALAZINE HCL 20 MG/ML IJ SOLN: 10.0000 mg | INTRAMUSCULAR | Status: AC | PRN

## 2019-11-01 SURGICAL SUPPLY — 15 items
BALLN TREK RX 2.5X15 (BALLOONS) ×3
BALLOON TREK RX 2.5X15 (BALLOONS) ×1 IMPLANT
CATH INFINITI 5FR ANG PIGTAIL (CATHETERS) ×3 IMPLANT
CATH INFINITI JR4 5F (CATHETERS) ×3 IMPLANT
CATH VISTA GUIDE 6FR JL3.5 (CATHETERS) ×3 IMPLANT
DEVICE INFLAT 30 PLUS (MISCELLANEOUS) ×3 IMPLANT
DEVICE RAD COMP TR BAND LRG (VASCULAR PRODUCTS) ×3 IMPLANT
GLIDESHEATH SLEND SS 6F .021 (SHEATH) ×3 IMPLANT
GUIDEWIRE INQWIRE 1.5J.035X260 (WIRE) ×1 IMPLANT
INQWIRE 1.5J .035X260CM (WIRE) ×3
KIT MANI 3VAL PERCEP (MISCELLANEOUS) ×3 IMPLANT
PACK CARDIAC CATH (CUSTOM PROCEDURE TRAY) ×3 IMPLANT
STENT RESOLUTE ONYX 3.0X18 (Permanent Stent) ×3 IMPLANT
WIRE ASAHI PROWATER 180CM (WIRE) ×3 IMPLANT
WIRE G HI TQ BMW 190 (WIRE) ×3 IMPLANT

## 2019-11-01 NOTE — ED Triage Notes (Signed)
Pt presents via acems code STEMI. Pt given nitro in route, 324 aspirin, 2L nasal cannula placed by ems. Pt states chest pain began around 1pm today while working outside. No known cardiac hx. Pt describes pain as tightness in center of chest. Pt diaphoretic, but alert and oriented x4 upon arrival.

## 2019-11-01 NOTE — Consult Note (Signed)
[facility] Cardiology  CARDIOLOGY CONSULT NOTE  Patient ID: Matthew Atkinson MRN: [id number] DOB/AGE: December 08, 1960 59 y.o.  Admit date: 11/01/2019 Referring Physician Joan Mayans Primary Physician [facility] Primary Cardiologist  Reason for Consultation posterior STEMI  HPI: Patient is a 59 year old gentleman with history of essential hypertension and tobacco abuse.  Presented to [facility] ED with 8 out of 10 substernal chest pain with shortness of breath.  Patient ports 2 to 3-day history of intermittent chest discomfort developed shortness of breath today which prompted him to call EMS.  In route, ECG revealed 1 to 2 mm of ST elevation in leads II, III and aVF with deep ST depression in leads V1, II and III.  Review of systems complete and found to be negative unless listed above     History reviewed. No pertinent past medical history.  History reviewed. No pertinent surgical history.  Medications Prior to Admission  Medication Sig Dispense Refill Last Dose  . predniSONE (STERAPRED UNI-PAK 21 TAB) 10 MG (21) TBPK tablet Take by mouth daily. 60 mg x 1 day, 50 mg x 1 day, 40 mg x 1 day, 30 mg x 1 day, 20 mg x 1 day, 10 mg x 1 day 21 tablet 0    Social History   Socioeconomic History  . Marital status: Single    Spouse name: Not on file  . Number of children: Not on file  . Years of education: Not on file  . Highest education level: Not on file  Occupational History  . Not on file  Tobacco Use  . Smoking status: Current Every Day Smoker    Packs/day: 0.50    Types: Cigarettes  . Smokeless tobacco: Never Used  Substance and Sexual Activity  . Alcohol use: Yes    Comment: occasionally  . Drug use: No  . Sexual activity: Not on file  Other Topics Concern  . Not on file  Social History Narrative  . Not on file   Social Determinants of Health   Financial Resource Strain:   . Difficulty of Paying Living Expenses:   Food Insecurity:   . Worried About Charity fundraiser in the Last  Year:   . Arboriculturist in the Last Year:   Transportation Needs:   . Film/video editor (Medical):   Marland Kitchen Lack of Transportation (Non-Medical):   Physical Activity:   . Days of Exercise per Week:   . Minutes of Exercise per Session:   Stress:   . Feeling of Stress :   Social Connections:   . Frequency of Communication with Friends and Family:   . Frequency of Social Gatherings with Friends and Family:   . Attends Religious Services:   . Active Member of Clubs or Organizations:   . Attends Archivist Meetings:   Marland Kitchen Marital Status:   Intimate Partner Violence:   . Fear of Current or Ex-Partner:   . Emotionally Abused:   Marland Kitchen Physically Abused:   . Sexually Abused:     Family History  Problem Relation Age of Onset  . Diabetes Father       Review of systems complete and found to be negative unless listed above      PHYSICAL EXAM  General: Well developed, well nourished, in no acute distress HEENT:  Normocephalic and atramatic Neck:  No JVD.  Lungs: Clear bilaterally to auscultation and percussion. Heart: HRRR . Normal S1 and S2 without gallops or murmurs.  Abdomen: Bowel sounds are positive, abdomen  soft and non-tender  Msk:  Back normal, normal gait. Normal strength and tone for age. Extremities: No clubbing, cyanosis or edema.   Neuro: Alert and oriented X 3. Psych:  Good affect, responds appropriately  Labs:   Lab Results  Component Value Date   WBC 8.7 11/01/2019   HGB 18.1 (H) 11/01/2019   HCT 52.9 (H) 11/01/2019   MCV 90.4 11/01/2019   PLT 190 11/01/2019    Recent Labs  Lab 11/01/19 1708  NA 140  K 4.5  CL 107  CO2 20*  BUN 19  CREATININE 1.96*  CALCIUM 8.8*  PROT 6.5  BILITOT 0.9  ALKPHOS 42  ALT 19  AST 20  GLUCOSE 135*   No results found for: CKTOTAL, CKMB, CKMBINDEX, TROPONINI No results found for: CHOL No results found for: HDL No results found for: LDLCALC No results found for: TRIG No results found for: CHOLHDL No  results found for: LDLDIRECT    Radiology: CARDIAC CATHETERIZATION  Result Date: 11/01/2019  Prox RCA lesion is 60% stenosed.  Dist RCA lesion is 30% stenosed.  Mid Cx lesion is 100% stenosed.  Mid LAD lesion is 30% stenosed.  A drug-eluting stent was successfully placed using a STENT RESOLUTE ONYX 3.0X18.  Post intervention, there is a 0% residual stenosis.  1.  Posterior ST elevation myocardial infarction 2.  Occluded mid left circumflex 3.  Mild reduced left ventricular function with LVEF 45 to 50% with posterior wall hypokinesis 4.  Successful PCI with DES mid left circumflex Recommendations 1.  Dual antiplatelet therapy uninterrupted for 1 year 2.  Aggrastat drip for 18 hours 3.  Atorvastatin 80 mg daily 4.  Metoprolol tartrate 25 mg daily 5.  Lisinopril 5 mg daily    EKG: Sinus rhythm with 1 to 2 mm ST elevation in leads II, III and aVF with 4 mm of ST depression in leads V1, II and III   ASSESSMENT AND PLAN:   1.  Posterior STEMI.  Patient presents with new onset chest pain, with ECG system with posterior wall ST elevation myocardial infarction 2.  Essential hypertension 3.  Tobacco abuse  Recommendations  1.  Emergent cardiac catheterization and probable primary PCI    Signed: Marcina Millard MD,PhD, [facility] 11/01/2019, 6:40 PM

## 2019-11-01 NOTE — H&P (Signed)
[facility] [place] NAME: Matthew Atkinson    MR#:  [id number]  DATE OF BIRTH:  Oct 23, 1960  DATE OF ADMISSION:  11/01/2019  PRIMARY CARE PHYSICIAN: [facility] [place]   REQUESTING/REFERRING PHYSICIAN: Isaias Cowman, MD  CHIEF COMPLAINT:   Chief Complaint  Patient presents with  . Code STEMI    HISTORY OF PRESENT ILLNESS:  Matthew Atkinson  is a 58 y.o. African-American male with a known history of hypertension ongoing tobacco abuse, presented to the emergency room with the onset of substernal chest pain felt as pressure and graded 8/10 in severity today after being intermittent over the last couple of days and feeling like indigestion.  It was associated with dyspnea today and mild diaphoresis especially after cutting a tree for his cousin after which she drove home from [facility] to [place] and his symptoms worsened.  He admitted to drowsiness with his symptoms and denied any nausea or vomiting.  No recent travels or surgeries.  No leg pain or edema, orthopnea or paroxysmal nocturnal dyspnea, cough or wheezing or hemoptysis.  No other bleeding diathesis.  He complained of muscle cramps in his fingers and legs.  His initial EKG was concerning for inferoposterior infarct as well as lateral infarct.  A code STEMI was called and the patient was taken to the Cath Lab and underwent PCI with mid RCA stent.  At this time the patient is chest pain-free during my interview.  Denies any dyspnea or cough or wheezing.  No fever or chills.  No nausea or vomiting.  When the patient came to the ER his blood pressure was 131/94 with a heart rate 108 and respirate of 25 and pulse oximetry 96% on room air.  During interview his blood pressure was getting up to 158/118 for which I ordered 2.5 mg of IV Lopressor especially with a heart rate at 95.  Initial labs showed a BUN of 19 and creatinine of 1.96 and CMP were otherwise normal.  Initial troponin was 25  and later 1020 CBC showed hemoconcentration. Latest EKG showed normal sinus rhythm with a rate of 89 with left atrial enlargement prolonged QT interval with QTC of 489 MS and correction of his ST segment elevation and depression.   The patient was placed on aspirin and Brilinta as well as Aggrastat, high-dose statin n.p.o. Zestril as well as Lopressor.  He is admitted to the ICU bed for further evaluation and management.  PAST MEDICAL HISTORY:  Hypertension and ongoing tobacco abuse as well as muscle cramps.  PAST SURGICAL HISTORY:  PCI and mid RCA stent.  SOCIAL HISTORY:   Social History   Tobacco Use  . Smoking status: Current Every Day Smoker    Packs/day: 0.50    Types: Cigarettes  . Smokeless tobacco: Never Used  Substance Use Topics  . Alcohol use: Yes    Comment: occasionally    FAMILY HISTORY:   Family History  Problem Relation Age of Onset  . Diabetes Father     DRUG ALLERGIES:  No Known Allergies  REVIEW OF SYSTEMS:   ROS As per history of present illness. All pertinent systems were reviewed above. Constitutional,  HEENT, cardiovascular, respiratory, GI, GU, musculoskeletal, neuro, psychiatric, endocrine,  integumentary and hematologic systems were reviewed and are otherwise  negative/unremarkable except for positive findings mentioned above in the HPI.   MEDICATIONS AT HOME:   Prior to Admission medications   Medication Sig Start Date End Date Taking? Authorizing Provider  predniSONE (STERAPRED UNI-PAK 21 TAB) 10 MG (21) TBPK tablet Take by mouth daily. 60 mg x 1 day, 50 mg x 1 day, 40 mg x 1 day, 30 mg x 1 day, 20 mg x 1 day, 10 mg x 1 day 05/20/19   Verlee Monte, NP  hydrochlorothiazide (MICROZIDE) 12.5 MG capsule Take 1 capsule (12.5 mg total) by mouth daily. 06/02/16 05/20/19  Hagler, Jami L, PA-C      VITAL SIGNS:  Blood pressure (!) 131/94, pulse 95, temperature 98.6 F (37 C), temperature source Oral, resp. rate 20, height 5\' 11"  (1.803 m),  weight 101.8 kg, SpO2 96 %.  PHYSICAL EXAMINATION:  Physical Exam  GENERAL:  59 y.o.-year-old African-American male patient lying in the bed with no acute distress.  EYES: Pupils equal, round, reactive to light and accommodation. No scleral icterus. Extraocular muscles intact.  HEENT: Head atraumatic, normocephalic. Oropharynx and nasopharynx clear.  NECK:  Supple, no jugular venous distention. No thyroid enlargement, no tenderness.  LUNGS: Normal breath sounds bilaterally, no wheezing, rales,rhonchi or crepitation. No use of accessory muscles of respiration.  CARDIOVASCULAR: Regular rate and rhythm, S1, S2 normal. No murmurs, rubs, or gallops.  ABDOMEN: Soft, nondistended, nontender. Bowel sounds present. No organomegaly or mass.  EXTREMITIES: No pedal edema, cyanosis, or clubbing.  NEUROLOGIC: Cranial nerves II through XII are intact. Muscle strength 5/5 in all extremities. Sensation intact. Gait not checked.  PSYCHIATRIC: The patient is alert and oriented x 3.  Normal affect and good eye contact. SKIN: No obvious rash, lesion, or ulcer.   LABORATORY PANEL:   CBC Recent Labs  Lab 11/01/19 1708  WBC 8.7  HGB 18.1*  HCT 52.9*  PLT 190   ------------------------------------------------------------------------------------------------------------------  Chemistries  Recent Labs  Lab 11/01/19 1708  NA 140  K 4.5  CL 107  CO2 20*  GLUCOSE 135*  BUN 19  CREATININE 1.96*  CALCIUM 8.8*  AST 20  ALT 19  ALKPHOS 42  BILITOT 0.9   ------------------------------------------------------------------------------------------------------------------  Cardiac Enzymes No results for input(s): TROPONINI in the last 168 hours. ------------------------------------------------------------------------------------------------------------------  RADIOLOGY:  CARDIAC CATHETERIZATION  Result Date: 11/01/2019  Prox RCA lesion is 60% stenosed.  Dist RCA lesion is 30% stenosed.  Mid Cx  lesion is 100% stenosed.  Mid LAD lesion is 30% stenosed.  A drug-eluting stent was successfully placed using a STENT RESOLUTE ONYX 3.0X18.  Post intervention, there is a 0% residual stenosis.  1.  Posterior ST elevation myocardial infarction 2.  Occluded mid left circumflex 3.  Mild reduced left ventricular function with LVEF 45 to 50% with posterior wall hypokinesis 4.  Successful PCI with DES mid left circumflex Recommendations 1.  Dual antiplatelet therapy uninterrupted for 1 year 2.  Aggrastat drip for 18 hours 3.  Atorvastatin 80 mg daily 4.  Metoprolol tartrate 25 mg daily 5.  Lisinopril 5 mg daily      IMPRESSION AND PLAN:   1.  Posterior STEMI status post PCI and stent. -The patient is admitted to an ICU bed. -He is placed on aspirin, Brilinta, IV Aggrastat, beta-blocker therapy with Lopressor and as needed IV Lopressor as well as as needed IV labetalol for suboptimal BP and high-dose statin. -Pain management will be provided with as needed IV morphine sulfate and sublingual nitroglycerin.  2.  Hypertensive urgency. -We will manage with as needed IV labetalol and Lopressor for optimization of heart rate.  3.  Ongoing tobacco abuse. -I counseled him for smoking cessation and he will receive further counseling  here.  4.  Muscle cramps. -He will be placed on as needed Flexeril.  5.  DVT prophylaxis. -Subcutaneous Lovenox  All the records are reviewed and case discussed with ED provider. The plan of care was discussed in details with the patient (and family). I answered all questions. The patient agreed to proceed with the above mentioned plan. Further management will depend upon hospital course.   CODE STATUS: Full code  Status is: Inpatient  Remains inpatient appropriate because:Unsafe d/c plan, IV treatments appropriate due to intensity of illness or inability to take PO and Inpatient level of care appropriate due to severity of illness   Dispo: The patient is from:  Home              Anticipated d/c is to: Home              Anticipated d/c date is: 2 days              Patient currently is not medically stable to d/c.    TOTAL TIME TAKING CARE OF THIS PATIENT: 55 minutes.    Hannah Beat M.D on 11/01/2019 at 7:49 PM  [facility]   From 7 PM-7 AM, contact night-coverage [email or web address]  CC: Primary care physician; [facility]   Note: This dictation was prepared with Nurse, children's dictation along with smaller phrase technology. Any transcriptional errors that result from this process are unintentional.

## 2019-11-01 NOTE — ED Notes (Signed)
Bedside handoff given to Revonda Standard, RN in cath lab.

## 2019-11-01 NOTE — ED Notes (Signed)
Cardiologist and MD Monks at bedside

## 2019-11-01 NOTE — ED Provider Notes (Signed)
[facility] Emergency Department Provider Note  ____________________________________________   First MD Initiated Contact with Patient 11/01/19 1707     (approximate)  I have reviewed the triage vital signs and the nursing notes.  History  Chief Complaint Code STEMI    HPI Matthew Atkinson is a 59 y.o. male with history of HTN, tobacco use who presents to the emergency department via EMS as a CODE STEMI.  Patient states he has been having intermittent discomfort that he thought was indigestion for the last few days, however this afternoon he was working in his yard, cutting down a tree when his chest discomfort got worse, and was associated with fatigue, nausea, diaphoresis, shortness of breath.  He describes it as a pressure sensation.  Central.  Moderate in severity.  Improved minimally with nitro given by EMS.  No radiation.  Received full dose ASA and nitro with EMS. No hx of similar symptoms.     Past Medical Hx HTN Tobacco use  Problem List HTN Tobacco ouse  Past Surgical Hx History reviewed. No pertinent surgical history.  Medications Prior to Admission medications   Medication Sig Start Date End Date Taking? Authorizing Provider  predniSONE (STERAPRED UNI-PAK 21 TAB) 10 MG (21) TBPK tablet Take by mouth daily. 60 mg x 1 day, 50 mg x 1 day, 40 mg x 1 day, 30 mg x 1 day, 20 mg x 1 day, 10 mg x 1 day 05/20/19   Verlee Monte, NP  hydrochlorothiazide (MICROZIDE) 12.5 MG capsule Take 1 capsule (12.5 mg total) by mouth daily. 06/02/16 05/20/19  Hagler, Jami L, PA-C    Allergies Patient has no known allergies.  Family Hx Family History  Problem Relation Age of Onset  . Diabetes Father     Social Hx Social History   Tobacco Use  . Smoking status: Current Every Day Smoker    Packs/day: 0.50    Types: Cigarettes  . Smokeless tobacco: Never Used  Substance Use Topics  . Alcohol use: Yes    Comment: occasionally  . Drug use: No      Review of Systems  Constitutional: Negative for fever. Negative for chills. Eyes: Negative for visual changes. ENT: Negative for sore throat. Cardiovascular: + for chest pain. Respiratory: + for shortness of breath. Gastrointestinal: + for nausea. Negative for vomiting.  Genitourinary: Negative for dysuria. Musculoskeletal: Negative for leg swelling. Skin: Negative for rash. + diaphoresis  Neurological: Negative for headaches.   Physical Exam  Vital Signs: ED Triage Vitals  Enc Vitals Group     BP --      Pulse --      Resp --      Temp 11/01/19 1703 98.6 F (37 C)     Temp Source 11/01/19 1703 Oral     SpO2 --      Weight 11/01/19 1705 225 lb 15.5 oz (102.5 kg)     Height 11/01/19 1705 5\' 11"  (1.803 m)     Head Circumference --      Peak Flow --      Pain Score 11/01/19 1705 7     Pain Loc --      Pain Edu? --      Excl. in GC? --     Constitutional: Alert and oriented.  NAD.  Head: Normocephalic. Atraumatic. Eyes: Conjunctivae clear. Sclera anicteric. Pupils equal and symmetric. Nose: No masses or lesions. No congestion or rhinorrhea. Mouth/Throat: Wearing mask.  Neck: No stridor. Trachea midline.  Cardiovascular:  HR low 100s, regular rhythm. Extremities well perfused. Respiratory: Normal respiratory effort.  Lungs CTAB. Genitourinary: Deferred. Musculoskeletal: No lower extremity edema. No deformities. Neurologic:  Normal speech and language. No gross focal or lateralizing neurologic deficits are appreciated.  Skin: Skin is diaphoretic.  Psychiatric: Mood and affect are appropriate for situation.  EKG  Personally reviewed and interpreted by myself.   EKG transmitted by EMS reveals ST elevations inferiorly w/ reciprocal changes.   Date: 11/01/19 Time: 1700 Rate: 109 Rhythm: sinus Axis: normal Intervals: WNL ST elevations in II, III, aVF and V5, V6 w/ reciprocal changes Acute STEMI    Procedures  Procedure(s) performed (including critical  care):  .Critical Care Performed by: Lilia Pro., MD Authorized by: Lilia Pro., MD   Critical care provider statement:    Critical care time (minutes):  25   Critical care was time spent personally by me on the following activities:  Discussions with consultants, evaluation of patient's response to treatment, examination of patient, ordering and performing treatments and interventions, ordering and review of laboratory studies, ordering and review of radiographic studies, pulse oximetry, re-evaluation of patient's condition, obtaining history from patient or surrogate and review of old charts     Initial Impression / Assessment and Plan / MDM / ED Course  59 y.o. male who presents to the ED for exertional chest pain, associated with diaphoresis, nausea, shortness of breath.  EMS EKG consistent with STEMI, activated as CODE STEMI. Patient received full dose ASA with EMS.  Patient arrives to the emergency department, hemodynamically stable, mildly tachycardic, normotensive.  EKG done in the ED confirms acute STEMI, as described above. Basic labs obtained. Cardiology at bedside to take to the Cath Lab.  Patient consented and is agreeable with the plan.   _______________________________   As part of my medical decision making I have reviewed available labs, radiology tests, reviewed old records/performed chart review, obtained additional history from family, and discussed with consultants (cardiology).     Final Clinical Impression(s) / ED Diagnosis  Final diagnoses:  ST elevation myocardial infarction (STEMI), unspecified artery ([place])       Note:  This document was prepared using Dragon voice recognition software and may include unintentional dictation errors.   Lilia Pro., MD 11/01/19 [phone]

## 2019-11-01 NOTE — Progress Notes (Signed)
   11/01/19 1800  Clinical Encounter Type  Visited With Patient and family together  Visit Type Initial;Other (Comment)  Referral From Nurse  Consult/Referral To Chaplain  Stress Factors  Patient Stress Factors Health changes  Family Stress Factors Health changes  Chaplain was page by [facility] to go sit with fianc'e of patient while waiting for doctor to talk with her. Chaplain made pastoral presence known and allow the fianc'e to share her story about meeting patient only 6 months ago. Nurse came to get fianc'e to see patient before going to ICU and doctor came out to talk to fianc'e. Chaplain walked fianc'e to ICU waiting room and checked in with nurse that family was in waiting area. Chaplain made sure fianc'e was ok before leaving, she was very appreciative

## 2019-11-02 ENCOUNTER — Encounter: Payer: Self-pay | Admitting: Cardiology

## 2019-11-02 DIAGNOSIS — E7849 Other hyperlipidemia: Secondary | ICD-10-CM

## 2019-11-02 DIAGNOSIS — R739 Hyperglycemia, unspecified: Secondary | ICD-10-CM

## 2019-11-02 LAB — CBC
HCT: 48.9 % (ref 39.0–52.0)
Hemoglobin: 16.4 g/dL (ref 13.0–17.0)
MCH: 31.4 pg (ref 26.0–34.0)
MCHC: 33.5 g/dL (ref 30.0–36.0)
MCV: 93.5 fL (ref 80.0–100.0)
Platelets: 174 10*3/uL (ref 150–400)
RBC: 5.23 MIL/uL (ref 4.22–5.81)
RDW: 13 % (ref 11.5–15.5)
WBC: 9.1 10*3/uL (ref 4.0–10.5)
nRBC: 0 % (ref 0.0–0.2)

## 2019-11-02 LAB — MAGNESIUM
Magnesium: 2.3 mg/dL (ref 1.7–2.4)
Magnesium: 2.3 mg/dL (ref 1.7–2.4)

## 2019-11-02 LAB — BASIC METABOLIC PANEL
Anion gap: 9 (ref 5–15)
BUN: 25 mg/dL — ABNORMAL HIGH (ref 8–23)
CO2: 22 mmol/L (ref 22–32)
Calcium: 8.3 mg/dL — ABNORMAL LOW (ref 8.9–10.3)
Chloride: 104 mmol/L (ref 98–111)
Creatinine, Ser: 1.43 mg/dL — ABNORMAL HIGH (ref 0.61–1.24)
GFR calc Af Amer: >60 mL/min (ref >60)
GFR calc non Af Amer: 52 mL/min — ABNORMAL LOW (ref >60)
Glucose, Bld: 143 mg/dL — ABNORMAL HIGH (ref 70–99)
Potassium: 4 mmol/L (ref 3.5–5.1)
Sodium: 135 mmol/L (ref 135–145)

## 2019-11-02 LAB — TROPONIN I (HIGH SENSITIVITY)
Troponin I (High Sensitivity): >27000 ng/L (ref <18)
Troponin I (High Sensitivity): >27000 ng/L (ref <18)

## 2019-11-02 LAB — GLUCOSE, CAPILLARY: Glucose-Capillary: 112 mg/dL — ABNORMAL HIGH (ref 70–99)

## 2019-11-02 MED ORDER — HYOSCYAMINE SULFATE 0.125 MG SL SUBL
0.2500 mg | SUBLINGUAL_TABLET | Freq: Once | SUBLINGUAL | Status: AC
  Administered 2019-11-02: 0.25 mg via SUBLINGUAL
  Filled 2019-11-02: qty 2

## 2019-11-02 MED ORDER — BISACODYL 5 MG PO TBEC: 10.0000 mg | DELAYED_RELEASE_TABLET | Freq: Every day | ORAL | Status: DC | PRN

## 2019-11-02 MED ORDER — ALUM & MAG HYDROXIDE-SIMETH 200-200-20 MG/5ML PO SUSP
30.0000 mL | Freq: Once | ORAL | Status: AC
  Administered 2019-11-02: 30 mL via ORAL
  Filled 2019-11-02: qty 30

## 2019-11-02 MED ORDER — CHLORHEXIDINE GLUCONATE CLOTH 2 % EX PADS
6.0000 | MEDICATED_PAD | Freq: Every day | CUTANEOUS | Status: DC
  Administered 2019-11-03: 6 via TOPICAL

## 2019-11-02 MED ORDER — METOPROLOL TARTRATE 50 MG PO TABS
50.0000 mg | ORAL_TABLET | Freq: Two times a day (BID) | ORAL | Status: DC
  Administered 2019-11-02 – 2019-11-03 (×2): 50 mg via ORAL
  Filled 2019-11-02 (×2): qty 1

## 2019-11-02 NOTE — Progress Notes (Signed)
Transferred patient to telemetry unit. His vitals are WDL.  Called and gave report to unit RN.

## 2019-11-02 NOTE — Progress Notes (Signed)
Patient complained of muscle spasms in his fingers; while explaining his discomfort he experienced an 11-beat run of v-tach with wide QRS.  Physician notified.

## 2019-11-02 NOTE — Progress Notes (Signed)
PROGRESS NOTE    Les Longmore  GQQ:[id number] DOB: 08/15/1960 DOA: 11/01/2019 PCP: [facility]       Assessment & Plan:   Active Problems:   Acute ST elevation myocardial infarction (STEMI) of posterior wall (HCC)   STEMI (ST elevation myocardial infarction) (HCC)   Posterior STEMI: s/p PCI and stent. Continue on aspirin, brilinta, statin, lisinopril & metoprolol as per cardio. Nitro & morphine prn for pain. Continue on tele. Cardio following and recs apprec  Hypertensive urgency: resolved. Continue on metoprolol, lisinopril for HTN  Tobacco abuse: smoking cessation counseling.  Muscle cramps: flexeril prn.  AKI: Cr is trending down today. Baseline Cr is unknown. Will continue to monitor  Hyperglycemia: no hx of DM but pt is overweight so will check HbA1c    DVT prophylaxis: SCDs Code Status: full Family Communication:  Disposition Plan: will likely d/c back home    Consultants:      Procedures:    Antimicrobials:    Subjective: Pt c/o muscle cramping on his hands  Objective: Vitals:   11/02/19 0400 11/02/19 0500 11/02/19 0600 11/02/19 0700  BP: 135/82 140/79 124/66 (!) 147/91  Pulse: 72 60 66 74  Resp: 16 15 15 16   Temp: 98.1 F (36.7 C)     TempSrc: Oral     SpO2: 98% 98% 98% 99%  Weight:      Height:        Intake/Output Summary (Last 24 hours) at 11/02/2019 0842 Last data filed at 11/02/2019 0830 Gross per 24 hour  Intake 204.22 ml  Output 1025 ml  Net -820.78 ml   Filed Weights   11/01/19 1705 11/01/19 1902  Weight: 102.5 kg 101.8 kg    Examination:  General exam: Appears calm and comfortable  Respiratory system: Clear to auscultation. Respiratory effort normal. Cardiovascular system: S1 & S2 +. No rubs, gallops or clicks.  Gastrointestinal system: Abdomen is nondistended, soft and nontender. Normal bowel sounds heard. Central nervous system: Alert and oriented. Moves all 4 extremities  Psychiatry:  Judgement and insight appear normal. Mood & affect appropriate.     Data Reviewed: I have personally reviewed following labs and imaging studies  CBC: Recent Labs  Lab 11/01/19 1708 11/02/19 0123  WBC 8.7 9.1  NEUTROABS 5.6  --   HGB 18.1* 16.4  HCT 52.9* 48.9  MCV 90.4 93.5  PLT 190 174   Basic Metabolic Panel: Recent Labs  Lab 11/01/19 1708 11/01/19 2336 11/02/19 0123  NA 140  --  135  K 4.5  --  4.0  CL 107  --  104  CO2 20*  --  22  GLUCOSE 135*  --  143*  BUN 19  --  25*  CREATININE 1.96*  --  1.43*  CALCIUM 8.8*  --  8.3*  MG  --  2.3  --    GFR: Estimated Creatinine Clearance: 65.9 mL/min (A) (by C-G formula based on SCr of 1.43 mg/dL (H)). Liver Function Tests: Recent Labs  Lab 11/01/19 1708  AST 20  ALT 19  ALKPHOS 42  BILITOT 0.9  PROT 6.5  ALBUMIN 3.6   No results for input(s): LIPASE, AMYLASE in the last 168 hours. No results for input(s): AMMONIA in the last 168 hours. Coagulation Profile: Recent Labs  Lab 11/01/19 1708  INR 1.0   Cardiac Enzymes: No results for input(s): CKTOTAL, CKMB, CKMBINDEX, TROPONINI in the last 168 hours. BNP (last 3 results) No results for input(s): PROBNP in the last 8760  hours. HbA1C: No results for input(s): HGBA1C in the last 72 hours. CBG: No results for input(s): GLUCAP in the last 168 hours. Lipid Profile: No results for input(s): CHOL, HDL, LDLCALC, TRIG, CHOLHDL, LDLDIRECT in the last 72 hours. Thyroid Function Tests: No results for input(s): TSH, T4TOTAL, FREET4, T3FREE, THYROIDAB in the last 72 hours. Anemia Panel: No results for input(s): VITAMINB12, FOLATE, FERRITIN, TIBC, IRON, RETICCTPCT in the last 72 hours. Sepsis Labs: No results for input(s): PROCALCITON, LATICACIDVEN in the last 168 hours.  Recent Results (from the past 240 hour(s))  SARS Coronavirus 2 by RT PCR (hospital order, performed in [facility] hospital lab) Nasopharyngeal Nasopharyngeal Swab     Status: None   Collection  Time: 11/01/19  5:09 PM   Specimen: Nasopharyngeal Swab  Result Value Ref Range Status   SARS Coronavirus 2 NEGATIVE NEGATIVE Final    Comment: (NOTE) SARS-CoV-2 target nucleic acids are NOT DETECTED. The SARS-CoV-2 RNA is generally detectable in upper and lower respiratory specimens during the acute phase of infection. The lowest concentration of SARS-CoV-2 viral copies this assay can detect is 250 copies / mL. A negative result does not preclude SARS-CoV-2 infection and should not be used as the sole basis for treatment or other patient management decisions.  A negative result may occur with improper specimen collection / handling, submission of specimen other than nasopharyngeal swab, presence of viral mutation(s) within the areas targeted by this assay, and inadequate number of viral copies (<250 copies / mL). A negative result must be combined with clinical observations, patient history, and epidemiological information. Fact Sheet for Patients:   StrictlyIdeas.no Fact Sheet for Healthcare Providers: BankingDealers.co.za This test is not yet approved or cleared  by the [place] FDA and has been authorized for detection and/or diagnosis of SARS-CoV-2 by FDA under an Emergency Use Authorization (EUA).  This EUA will remain in effect (meaning this test can be used) for the duration of the COVID-19 declaration under Section 564(b)(1) of the Act, 21 U.S.C. section 360bbb-3(b)(1), unless the authorization is terminated or revoked sooner. Performed at [facility], [address], [place], [place] [address]          Radiology Studies: CARDIAC CATHETERIZATION  Result Date: 11/01/2019  Prox RCA lesion is 60% stenosed.  Dist RCA lesion is 30% stenosed.  Mid Cx lesion is 100% stenosed.  Mid LAD lesion is 30% stenosed.  A drug-eluting stent was successfully placed using a STENT RESOLUTE ONYX 3.0X18.  Post intervention,  there is a 0% residual stenosis.  1.  Posterior ST elevation myocardial infarction 2.  Occluded mid left circumflex 3.  Mild reduced left ventricular function with LVEF 45 to 50% with posterior wall hypokinesis 4.  Successful PCI with DES mid left circumflex Recommendations 1.  Dual antiplatelet therapy uninterrupted for 1 year 2.  Aggrastat drip for 18 hours 3.  Atorvastatin 80 mg daily 4.  Metoprolol tartrate 25 mg daily 5.  Lisinopril 5 mg daily        Scheduled Meds: . aspirin  81 mg Oral Daily  . atorvastatin  80 mg Oral Daily  . Chlorhexidine Gluconate Cloth  6 each Topical Daily  . docusate sodium  100 mg Oral BID  . lisinopril  5 mg Oral Daily  . metoprolol tartrate  25 mg Oral BID  . sodium chloride flush  3 mL Intravenous Q12H  . ticagrelor  90 mg Oral BID   Continuous Infusions: . sodium chloride    . tirofiban 0.075  mcg/kg/min (11/02/19 0700)     LOS: 1 day    Time spent: 34 mins     Charise Killian, MD [facility] Pager [phone]  If 7PM-7AM, please contact night-coverage [email or web address] 11/02/2019, 8:42 AM

## 2019-11-02 NOTE — Progress Notes (Signed)
[facility] Card[facility]  SUBJECTIVE: Patient laying in bed, reports feeling well, denies chest pain or shortness of breath   Vitals:   11/02/19 0600 11/02/19 0700 11/02/19 0800 11/02/19 0900  BP: 124/66 (!) 147/91 130/79 135/83  Pulse: 66 74 77 84  Resp: 15 16 15 16   Temp:   98 F (36.7 C)   TempSrc:   Oral   SpO2: 98% 99% 96% 96%  Weight:      Height:         Intake/Output Summary (Last 24 hours) at 11/02/2019 9622 Last data filed at 11/02/2019 0830 Gross per 24 hour  Intake 204.22 ml  Output 1025 ml  Net -820.78 ml      PHYSICAL EXAM  General: Well developed, well nourished, in no acute distress HEENT:  Normocephalic and atramatic Neck:  No JVD.  Lungs: Clear bilaterally to auscultation and percussion. Heart: HRRR . Normal S1 and S2 without gallops or murmurs.  Abdomen: Bowel sounds are positive, abdomen soft and non-tender  Msk:  Back normal, normal gait. Normal strength and tone for age. Extremities: No clubbing, cyanosis or edema.   Neuro: Alert and oriented X 3. Psych:  Good affect, responds appropriately   LABS: Basic Metabolic Panel: Recent Labs    11/01/19 1708 11/01/19 2336 11/02/19 0123  NA 140  --  135  K 4.5  --  4.0  CL 107  --  104  CO2 20*  --  22  GLUCOSE 135*  --  143*  BUN 19  --  25*  CREATININE 1.96*  --  1.43*  CALCIUM 8.8*  --  8.3*  MG  --  2.3  --    Liver Function Tests: Recent Labs    11/01/19 1708  AST 20  ALT 19  ALKPHOS 42  BILITOT 0.9  PROT 6.5  ALBUMIN 3.6   No results for input(s): LIPASE, AMYLASE in the last 72 hours. CBC: Recent Labs    11/01/19 1708 11/02/19 0123  WBC 8.7 9.1  NEUTROABS 5.6  --   HGB 18.1* 16.4  HCT 52.9* 48.9  MCV 90.4 93.5  PLT 190 174   Cardiac Enzymes: No results for input(s): CKTOTAL, CKMB, CKMBINDEX, TROPONINI in the last 72 hours. BNP: Invalid input(s): POCBNP D-Dimer: No results for input(s): DDIMER in the last 72 hours. Hemoglobin A1C: No results for input(s): HGBA1C in the  last 72 hours. Fasting Lipid Panel: No results for input(s): CHOL, HDL, LDLCALC, TRIG, CHOLHDL, LDLDIRECT in the last 72 hours. Thyroid Function Tests: No results for input(s): TSH, T4TOTAL, T3FREE, THYROIDAB in the last 72 hours.  Invalid input(s): FREET3 Anemia Panel: No results for input(s): VITAMINB12, FOLATE, FERRITIN, TIBC, IRON, RETICCTPCT in the last 72 hours.  CARDIAC CATHETERIZATION  Result Date: 11/01/2019  Prox RCA lesion is 60% stenosed.  Dist RCA lesion is 30% stenosed.  Mid Cx lesion is 100% stenosed.  Mid LAD lesion is 30% stenosed.  A drug-eluting stent was successfully placed using a STENT RESOLUTE ONYX 3.0X18.  Post intervention, there is a 0% residual stenosis.  1.  Posterior ST elevation myocardial infarction 2.  Occluded mid left circumflex 3.  Mild reduced left ventricular function with LVEF 45 to 50% with posterior wall hypokinesis 4.  Successful PCI with DES mid left circumflex Recommendations 1.  Dual antiplatelet therapy uninterrupted for 1 year 2.  Aggrastat drip for 18 hours 3.  Atorvastatin 80 mg daily 4.  Metoprolol tartrate 25 mg daily 5.  Lisinopril 5 mg daily  Echo pending  TELEMETRY: Sinus:  ASSESSMENT AND PLAN:  Active Problems:   Acute ST elevation myocardial infarction (STEMI) of posterior wall (HCC)   STEMI (ST elevation myocardial infarction) (HCC)    1.  Posterior STEMI,staus post DES proximal left circumflex, currently chest pain-free ( 1020, >27000, 31961 ) 2.  Essential hypertension, blood pressure under reasonable control on metoprolol tartrate and lisinopril 3.  Ongoing tobacco abuse 4.  Chronic kidney disease  Recommendations  1.  Continue current therapy 2.  Continue dual antiplatelet therapy uninterrupted for 1 year 3.  Transfer to telemetry 4.  Review 2D echocardiogram 5.  High intensity atorvastatin 6.  Advised tobacco cessation   Marcina Millard, MD, PhD, [facility] 11/02/2019 9:26 AM

## 2019-11-03 ENCOUNTER — Ambulatory Visit: Payer: Self-pay

## 2019-11-03 DIAGNOSIS — I10 Essential (primary) hypertension: Secondary | ICD-10-CM

## 2019-11-03 LAB — BASIC METABOLIC PANEL
Anion gap: 8 (ref 5–15)
BUN: 17 mg/dL (ref 8–23)
CO2: 23 mmol/L (ref 22–32)
Calcium: 8.1 mg/dL — ABNORMAL LOW (ref 8.9–10.3)
Chloride: 104 mmol/L (ref 98–111)
Creatinine, Ser: 0.94 mg/dL (ref 0.61–1.24)
GFR calc Af Amer: >60 mL/min (ref >60)
GFR calc non Af Amer: >60 mL/min (ref >60)
Glucose, Bld: 109 mg/dL — ABNORMAL HIGH (ref 70–99)
Potassium: 4.1 mmol/L (ref 3.5–5.1)
Sodium: 135 mmol/L (ref 135–145)

## 2019-11-03 LAB — CBC
HCT: 49 % (ref 39.0–52.0)
Hemoglobin: 16.4 g/dL (ref 13.0–17.0)
MCH: 31.6 pg (ref 26.0–34.0)
MCHC: 33.5 g/dL (ref 30.0–36.0)
MCV: 94.4 fL (ref 80.0–100.0)
Platelets: 161 10*3/uL (ref 150–400)
RBC: 5.19 MIL/uL (ref 4.22–5.81)
RDW: 13 % (ref 11.5–15.5)
WBC: 5.1 10*3/uL (ref 4.0–10.5)
nRBC: 0 % (ref 0.0–0.2)

## 2019-11-03 LAB — HEMOGLOBIN A1C
Hgb A1c MFr Bld: 6.3 % — ABNORMAL HIGH (ref 4.8–5.6)
Mean Plasma Glucose: 134.11 mg/dL

## 2019-11-03 MED ORDER — ATORVASTATIN CALCIUM 80 MG PO TABS: 80.0000 mg | ORAL_TABLET | Freq: Every day | ORAL | 0 refills | Status: AC

## 2019-11-03 MED ORDER — METOPROLOL TARTRATE 50 MG PO TABS: 50.0000 mg | ORAL_TABLET | Freq: Two times a day (BID) | ORAL | 0 refills | Status: AC

## 2019-11-03 MED ORDER — TICAGRELOR 90 MG PO TABS: 90.0000 mg | ORAL_TABLET | Freq: Two times a day (BID) | ORAL | 0 refills | Status: AC

## 2019-11-03 MED ORDER — ASPIRIN 81 MG PO CHEW: 81.0000 mg | CHEWABLE_TABLET | Freq: Every day | ORAL | Status: AC

## 2019-11-03 MED ORDER — LISINOPRIL 5 MG PO TABS: 5.0000 mg | ORAL_TABLET | Freq: Every day | ORAL | 0 refills | Status: AC

## 2019-11-03 NOTE — Plan of Care (Signed)

## 2019-11-03 NOTE — Discharge Instructions (Signed)
Heart Attack A heart attack occurs when blood and oxygen supply to the heart is cut off. A heart attack causes damage to the heart that cannot be fixed. A heart attack is also called a myocardial infarction, or MI. If you think you are having a heart attack, do not wait to see if the symptoms will go away. Get medical help right away. What are the causes? This condition may be caused by:  A fatty substance (plaque) in the blood vessels (arteries). This can block the flow of blood to the heart.  A blood clot in the blood vessels that go to the heart. The blood clot blocks blood flow.  Low blood pressure.  An abnormal heartbeat.  Some diseases, such as problems in red blood cells (anemia)orproblems in breathing (respiratory failure).  Tightening (spasm) of a blood vessel that cuts off blood to the heart.  A tear in a blood vessel of the heart.  High blood pressure. What increases the risk? The following factors may make you more likely to develop this condition:  Aging. The older you are, the higher your risk.  Having a personal or family history of chest pain, heart attack, stroke, or narrowing of the arteries in the legs, arms, head, or stomach (peripheral artery disease).  Being male.  Smoking.  Not getting regular exercise.  Being overweight or obese.  Having high blood pressure.  Having high cholesterol.  Having diabetes.  Drinking too much alcohol.  Using illegal drugs, such as cocaine or methamphetamine. What are the signs or symptoms? Symptoms of this condition include:  Chest pain. It may feel like: ? Crushing or squeezing. ? Tightness, pressure, fullness, or heaviness.  Pain in the arm, neck, jaw, back, or upper body.  Shortness of breath.  Heartburn.  Upset stomach (indigestion).  Feeling like you may vomit (nauseous).  Cold sweats.  Feeling tired.  Sudden light-headedness. How is this treated? A heart attack must be treated as soon as  possible. Treatment may include:  Medicines to: ? Break up or dissolve blood clots. ? Thin blood and help prevent blood clots. ? Treat blood pressure. ? Improve blood flow to the heart. ? Reduce pain. ? Reduce cholesterol.  Procedures to widen a blocked artery and keep it open.  Open heart surgery.  Receiving oxygen.  Making your heart strong again (cardiac rehabilitation) through exercise, education, and counseling. Follow these instructions at home: Medicines  Take over-the-counter and prescription medicines only as told by your doctor. You may need to take medicine: ? To keep your blood from clotting too easily. ? To control blood pressure. ? To lower cholesterol. ? To control heart rhythms.  Do not take these medicines unless your doctor says it is okay: ? NSAIDs, such as ibuprofen. ? Supplements that have vitamin A, vitamin E, or both. ? Hormone replacement therapy that has estrogen with or without progestin. Lifestyle      Do not use any products that have nicotine or tobacco, such as cigarettes, e-cigarettes, and chewing tobacco. If you need help quitting, ask your doctor.  Avoid secondhand smoke.  Exercise regularly. Ask your doctor about a cardiac rehab program.  Eat heart-healthy foods. Your doctor will tell you what foods to eat.  Stay at a healthy weight.  Lower your stress level.  Do not use illegal drugs. Alcohol use  Do not drink alcohol if: ? Your doctor tells you not to drink. ? You are pregnant, may be pregnant, or are planning to become pregnant.    If you drink alcohol: ? Limit how much you use to:  0-1 drink a day for women.  0-2 drinks a day for men. ? Know how much alcohol is in your drink. In the [place] one drink equals one 12 oz bottle of beer (355 mL), one 5 oz glass of wine (148 mL), or one 1 oz glass of hard liquor (44 mL). General instructions  Work with your doctor to treat other problems you may have, such as diabetes or high  blood pressure.  Get screened for depression. Get treatment if needed.  Keep your vaccines up to date. Get the flu shot (influenza vaccine) every year.  Keep all follow-up visits as told by your doctor. This is important. Contact a doctor if:  You feel very sad.  You have trouble doing your daily activities. Get help right away if:  You have sudden, unexplained discomfort in your chest, arms, back, neck, jaw, or upper body.  You have shortness of breath.  You have sudden sweating or clammy skin.  You feel like you may vomit.  You vomit.  You feel tired or weak.  You get light-headed or dizzy.  You feel your heart beating fast.  You feel your heart skipping beats.  You have blood pressure that is higher than 180/120. These symptoms may be an emergency. Do not wait to see if the symptoms will go away. Get medical help right away. Call your local emergency services (911 in the [place] Do not drive yourself to the hospital. Summary  A heart attack occurs when blood and oxygen supply to the heart is cut off.  Do not take NSAIDs unless your doctor says it is okay.  Do not smoke. Avoid secondhand smoke.  Exercise regularly. Ask your doctor about a cardiac rehab program. This information is not intended to replace advice given to you by your health care provider. Make sure you discuss any questions you have with your health care provider. Document Revised: 09/11/2018 Document Reviewed: 09/11/2018 Elsevier Patient Education  2020 [employer]  

## 2019-11-03 NOTE — Progress Notes (Signed)
[facility] Cardiology  SUBJECTIVE: Patient sitting in bed, denies chest pain   Vitals:   11/02/19 1414 11/02/19 1959 11/03/19 0420 11/03/19 0830  BP:  (!) 142/84 138/82 123/83  Pulse:  73 66 61  Resp:  20 20 18   Temp:  98.5 F (36.9 C) 98.4 F (36.9 C)   TempSrc:  Oral Oral   SpO2:  99% 100% 99%  Weight: 99.6 kg  99.4 kg   Height: 5\' 11"  (1.803 m)        Intake/Output Summary (Last 24 hours) at 11/03/2019 1324 Last data filed at 11/02/2019 1700 Gross per 24 hour  Intake 786.9 ml  Output --  Net 786.9 ml      PHYSICAL EXAM  General: Well developed, well nourished, in no acute distress HEENT:  Normocephalic and atramatic Neck:  No JVD.  Lungs: Clear bilaterally to auscultation and percussion. Heart: HRRR . Normal S1 and S2 without gallops or murmurs.  Abdomen: Bowel sounds are positive, abdomen soft and non-tender  Msk:  Back normal, normal gait. Normal strength and tone for age. Extremities: No clubbing, cyanosis or edema.   Neuro: Alert and oriented X 3. Psych:  Good affect, responds appropriately   LABS: Basic Metabolic Panel: Recent Labs    11/01/19 1708 11/01/19 2336 11/02/19 0123 11/02/19 0712 11/03/19 0712  NA   < >  --  135  --  135  K   < >  --  4.0  --  4.1  CL   < >  --  104  --  104  CO2   < >  --  22  --  23  GLUCOSE   < >  --  143*  --  109*  BUN   < >  --  25*  --  17  CREATININE   < >  --  1.43*  --  0.94  CALCIUM   < >  --  8.3*  --  8.1*  MG  --  2.3  --  2.3  --    < > = values in this interval not displayed.   Liver Function Tests: Recent Labs    11/01/19 1708  AST 20  ALT 19  ALKPHOS 42  BILITOT 0.9  PROT 6.5  ALBUMIN 3.6   No results for input(s): LIPASE, AMYLASE in the last 72 hours. CBC: Recent Labs    11/01/19 1708 11/01/19 1708 11/02/19 0123 11/03/19 0712  WBC 8.7   < > 9.1 5.1  NEUTROABS 5.6  --   --   --   HGB 18.1*   < > 16.4 16.4  HCT 52.9*   < > 48.9 49.0  MCV 90.4   < > 93.5 94.4  PLT 190   < > 174 161   < >  = values in this interval not displayed.   Cardiac Enzymes: No results for input(s): CKTOTAL, CKMB, CKMBINDEX, TROPONINI in the last 72 hours. BNP: Invalid input(s): POCBNP D-Dimer: No results for input(s): DDIMER in the last 72 hours. Hemoglobin A1C: No results for input(s): HGBA1C in the last 72 hours. Fasting Lipid Panel: No results for input(s): CHOL, HDL, LDLCALC, TRIG, CHOLHDL, LDLDIRECT in the last 72 hours. Thyroid Function Tests: No results for input(s): TSH, T4TOTAL, T3FREE, THYROIDAB in the last 72 hours.  Invalid input(s): FREET3 Anemia Panel: No results for input(s): VITAMINB12, FOLATE, FERRITIN, TIBC, IRON, RETICCTPCT in the last 72 hours.  CARDIAC CATHETERIZATION  Result Date: 11/01/2019  Prox RCA lesion is 60%  stenosed.  Dist RCA lesion is 30% stenosed.  Mid Cx lesion is 100% stenosed.  Mid LAD lesion is 30% stenosed.  A drug-eluting stent was successfully placed using a STENT RESOLUTE ONYX 3.0X18.  Post intervention, there is a 0% residual stenosis.  1.  Posterior ST elevation myocardial infarction 2.  Occluded mid left circumflex 3.  Mild reduced left ventricular function with LVEF 45 to 50% with posterior wall hypokinesis 4.  Successful PCI with DES mid left circumflex Recommendations 1.  Dual antiplatelet therapy uninterrupted for 1 year 2.  Aggrastat drip for 18 hours 3.  Atorvastatin 80 mg daily 4.  Metoprolol tartrate 25 mg daily 5.  Lisinopril 5 mg daily     Echo   TELEMETRY: Sinus rhythm:  ASSESSMENT AND PLAN:  Active Problems:   Acute ST elevation myocardial infarction (STEMI) of posterior wall (HCC)   STEMI (ST elevation myocardial infarction) (HCC)    1.  Posterior STEMI, status post DES proximal left circumflex, been chest pain-free x48 hours 2.  Essential hypertension, blood pressure under reasonable control currently on metoprolol tartrate and lisinopril 3.  Tobacco abuse 4.  Chronic kidney disease, BUN and creatinine seventeen 0.94 improved  since admission 5.  Brief nonsustained VT, resolved  Recommendations  1.  Continue current therapy 2.  Continue dual antiplatelet therapy uninterrupted for 1 year 3.  Continue high-sensitivity troponin 4.  Advised tobacco cessation 5.  Cardiac rehabilitation as outpatient 6.  May discharge home today 7.  Follow-up in 1 week   Marcina Millard, MD, PhD, [facility] 11/03/2019 9:29 AM

## 2019-11-03 NOTE — Discharge Summary (Signed)
Physician Discharge Summary  Matthew Atkinson LNL:[id number] DOB: 09/06/1960 DOA: 11/01/2019  PCP: [facility] [place] date: 11/01/2019 Discharge date: 11/03/2019  Admitted From: home  Disposition: home   Recommendations for Outpatient Follow-up:  1. Follow up with PCP in 1-2 weeks 2. F/u w/ cardiology, Dr. Saralyn Pilar, in 1 week  Home Health: no Equipment/Devices:  Discharge Condition: stable CODE STATUS: full  Diet recommendation: Heart Healthy  Brief/Interim Summary: HPI was taken from Dr. Sidney Ace: Matthew Atkinson  is a 59 y.o. African-American male with a known history of hypertension ongoing tobacco abuse, presented to the emergency room with the onset of substernal chest pain felt as pressure and graded 8/10 in severity today after being intermittent over the last couple of days and feeling like indigestion.  It was associated with dyspnea today and mild diaphoresis especially after cutting a tree for his cousin after which she drove home from [facility] to [place] and his symptoms worsened.  He admitted to drowsiness with his symptoms and denied any nausea or vomiting.  No recent travels or surgeries.  No leg pain or edema, orthopnea or paroxysmal nocturnal dyspnea, cough or wheezing or hemoptysis.  No other bleeding diathesis.  He complained of muscle cramps in his fingers and legs.  His initial EKG was concerning for inferoposterior infarct as well as lateral infarct.  A code STEMI was called and the patient was taken to the Cath Lab and underwent PCI with mid RCA stent.  At this time the patient is chest pain-free during my interview.  Denies any dyspnea or cough or wheezing.  No fever or chills.  No nausea or vomiting.  When the patient came to the ER his blood pressure was 131/94 with a heart rate 108 and respirate of 25 and pulse oximetry 96% on room air.  During interview his blood pressure was getting up to 158/118 for which I ordered 2.5 mg of IV  Lopressor especially with a heart rate at 95.  Initial labs showed a BUN of 19 and creatinine of 1.96 and CMP were otherwise normal.  Initial troponin was 25 and later 1020 CBC showed hemoconcentration. Latest EKG showed normal sinus rhythm with a rate of 89 with left atrial enlargement prolonged QT interval with QTC of 489 MS and correction of his ST segment elevation and depression.   The patient was placed on aspirin and Brilinta as well as Aggrastat, high-dose statin n.p.o. Zestril as well as Lopressor.  He is admitted to the ICU bed for further evaluation and management.  Hospital Course from Dr. Lenise Herald 5/21-5/22/21: Pt was found to have STEMI and was treated w/ cardiac cath w/ stent placement to left circumflex. Pt was started on aspirin, brilinta, statin, lisinopril & metoprolol. Pt will f/u w/ cardio, Dr. Saralyn Pilar, in 1 week. Pt verbalized his understanding. Of note, pt does not have insurance so CM was able to give the pt a coupon card for brilinta and as well as give the pt information on how to obtain health insurance. 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   Discharge Diagnoses:  Active Problems:   Acute ST elevation myocardial infarction (STEMI) of posterior wall (HCC)   STEMI (ST elevation myocardial infarction) ([place])   Posterior STEMI: s/p PCI and stent. Continue on aspirin, brilinta, statin, lisinopril & metoprolol as per cardio. Nitro & morphine prn for pain. Continue on tele. Cardio following and recs apprec  Hypertensive urgency: resolved. Continue on metoprolol, lisinopril for HTN  Tobacco  abuse: smoking cessation counseling.  Muscle cramps: flexeril prn.  AKI: resolved  Hyperglycemia: no hx of DM but pt is overweight so will check HbA1c which was pending at time of d/c  Discharge Instructions  Discharge Instructions    AMB Referral to Cardiac Rehabilitation - Phase II   Complete by: As directed    Diagnosis:  STEMI Coronary Stents     After  initial evaluation and assessments completed: Virtual Based Care may be provided alone or in conjunction with Phase 2 Cardiac Rehab based on patient barriers.: Yes   Diet - low sodium heart healthy   Complete by: As directed    Discharge instructions   Complete by: As directed    F/u cardiology, Dr. Darrold Junker, in 1 week; F/u PCP in 1-2 weeks   Increase activity slowly   Complete by: As directed      Allergies as of 11/03/2019   No Known Allergies     Medication List    STOP taking these medications   predniSONE 10 MG (21) Tbpk tablet Commonly known as: STERAPRED UNI-PAK 21 TAB     TAKE these medications   aspirin 81 MG chewable tablet Chew 1 tablet (81 mg total) by mouth daily. Start taking on: Nov 04, 2019   atorvastatin 80 MG tablet Commonly known as: LIPITOR Take 1 tablet (80 mg total) by mouth daily. Start taking on: Nov 04, 2019   lisinopril 5 MG tablet Commonly known as: ZESTRIL Take 1 tablet (5 mg total) by mouth daily. Start taking on: Nov 04, 2019   metoprolol tartrate 50 MG tablet Commonly known as: LOPRESSOR Take 1 tablet (50 mg total) by mouth 2 (two) times daily.   ticagrelor 90 MG Tabs tablet Commonly known as: BRILINTA Take 1 tablet (90 mg total) by mouth 2 (two) times daily.       No Known Allergies  Consultations: Cardio, Dr. Darrold Junker  Procedures/Studies: CARDIAC CATHETERIZATION  Result Date: 11/01/2019  Prox RCA lesion is 60% stenosed.  Dist RCA lesion is 30% stenosed.  Mid Cx lesion is 100% stenosed.  Mid LAD lesion is 30% stenosed.  A drug-eluting stent was successfully placed using a STENT RESOLUTE ONYX 3.0X18.  Post intervention, there is a 0% residual stenosis.  1.  Posterior ST elevation myocardial infarction 2.  Occluded mid left circumflex 3.  Mild reduced left ventricular function with LVEF 45 to 50% with posterior wall hypokinesis 4.  Successful PCI with DES mid left circumflex Recommendations 1.  Dual antiplatelet therapy  uninterrupted for 1 year 2.  Aggrastat drip for 18 hours 3.  Atorvastatin 80 mg daily 4.  Metoprolol tartrate 25 mg daily 5.  Lisinopril 5 mg daily     Subjective: pt denies any complaints.   Discharge Exam: Vitals:   11/03/19 0420 11/03/19 0830  BP: 138/82 123/83  Pulse: 66 61  Resp: 20 18  Temp: 98.4 F (36.9 C)   SpO2: 100% 99%   Vitals:   11/02/19 1414 11/02/19 1959 11/03/19 0420 11/03/19 0830  BP:  (!) 142/84 138/82 123/83  Pulse:  73 66 61  Resp:  20 20 18   Temp:  98.5 F (36.9 C) 98.4 F (36.9 C)   TempSrc:  Oral Oral   SpO2:  99% 100% 99%  Weight: 99.6 kg  99.4 kg   Height: 5\' 11"  (1.803 m)       General: Pt is alert, awake, not in acute distress Cardiovascular:  S1/S2 +, no rubs, no gallops Respiratory: CTA bilaterally,  no wheezing, no rhonchi Abdominal: Soft, NT, ND, bowel sounds + Extremities: no edema, no cyanosis    The results of significant diagnostics from this hospitalization (including imaging, microbiology, ancillary and laboratory) are listed below for reference.     Microbiology: Recent Results (from the past 240 hour(s))  SARS Coronavirus 2 by RT PCR (hospital order, performed in [facility] hospital lab) Nasopharyngeal Nasopharyngeal Swab     Status: None   Collection Time: 11/01/19  5:09 PM   Specimen: Nasopharyngeal Swab  Result Value Ref Range Status   SARS Coronavirus 2 NEGATIVE NEGATIVE Final    Comment: (NOTE) SARS-CoV-2 target nucleic acids are NOT DETECTED. The SARS-CoV-2 RNA is generally detectable in upper and lower respiratory specimens during the acute phase of infection. The lowest concentration of SARS-CoV-2 viral copies this assay can detect is 250 copies / mL. A negative result does not preclude SARS-CoV-2 infection and should not be used as the sole basis for treatment or other patient management decisions.  A negative result may occur with improper specimen collection / handling, submission of specimen other than  nasopharyngeal swab, presence of viral mutation(s) within the areas targeted by this assay, and inadequate number of viral copies (<250 copies / mL). A negative result must be combined with clinical observations, patient history, and epidemiological information. Fact Sheet for Patients:   BoilerBrush.com.cy Fact Sheet for Healthcare Providers: https://pope.com/ This test is not yet approved or cleared  by the [place] FDA and has been authorized for detection and/or diagnosis of SARS-CoV-2 by FDA under an Emergency Use Authorization (EUA).  This EUA will remain in effect (meaning this test can be used) for the duration of the COVID-19 declaration under Section 564(b)(1) of the Act, 21 U.S.C. section 360bbb-3(b)(1), unless the authorization is terminated or revoked sooner. Performed at [facility], [address] [place], [place] [address]      Labs: BNP (last 3 results) No results for input(s): BNP in the last 8760 hours. Basic Metabolic Panel: Recent Labs  Lab 11/01/19 1708 11/01/19 2336 11/02/19 0123 11/02/19 0712 11/03/19 0712  NA 140  --  135  --  135  K 4.5  --  4.0  --  4.1  CL 107  --  104  --  104  CO2 20*  --  22  --  23  GLUCOSE 135*  --  143*  --  109*  BUN 19  --  25*  --  17  CREATININE 1.96*  --  1.43*  --  0.94  CALCIUM 8.8*  --  8.3*  --  8.1*  MG  --  2.3  --  2.3  --    Liver Function Tests: Recent Labs  Lab 11/01/19 1708  AST 20  ALT 19  ALKPHOS 42  BILITOT 0.9  PROT 6.5  ALBUMIN 3.6   No results for input(s): LIPASE, AMYLASE in the last 168 hours. No results for input(s): AMMONIA in the last 168 hours. CBC: Recent Labs  Lab 11/01/19 1708 11/02/19 0123 11/03/19 0712  WBC 8.7 9.1 5.1  NEUTROABS 5.6  --   --   HGB 18.1* 16.4 16.4  HCT 52.9* 48.9 49.0  MCV 90.4 93.5 94.4  PLT 190 174 161   Cardiac Enzymes: No results for input(s): CKTOTAL, CKMB, CKMBINDEX, TROPONINI in  the last 168 hours. BNP: Invalid input(s): POCBNP CBG: Recent Labs  Lab 11/01/19 1855  GLUCAP 112*   D-Dimer No results for input(s): DDIMER in the last 72  hours. Hgb A1c No results for input(s): HGBA1C in the last 72 hours. Lipid Profile No results for input(s): CHOL, HDL, LDLCALC, TRIG, CHOLHDL, LDLDIRECT in the last 72 hours. Thyroid function studies No results for input(s): TSH, T4TOTAL, T3FREE, THYROIDAB in the last 72 hours.  Invalid input(s): FREET3 Anemia work up No results for input(s): VITAMINB12, FOLATE, FERRITIN, TIBC, IRON, RETICCTPCT in the last 72 hours. Urinalysis No results found for: COLORURINE, APPEARANCEUR, LABSPEC, PHURINE, GLUCOSEU, HGBUR, BILIRUBINUR, KETONESUR, PROTEINUR, UROBILINOGEN, NITRITE, LEUKOCYTESUR Sepsis Labs Invalid input(s): PROCALCITONIN,  WBC,  LACTICIDVEN Microbiology Recent Results (from the past 240 hour(s))  SARS Coronavirus 2 by RT PCR (hospital order, performed in [facility] hospital lab) Nasopharyngeal Nasopharyngeal Swab     Status: None   Collection Time: 11/01/19  5:09 PM   Specimen: Nasopharyngeal Swab  Result Value Ref Range Status   SARS Coronavirus 2 NEGATIVE NEGATIVE Final    Comment: (NOTE) SARS-CoV-2 target nucleic acids are NOT DETECTED. The SARS-CoV-2 RNA is generally detectable in upper and lower respiratory specimens during the acute phase of infection. The lowest concentration of SARS-CoV-2 viral copies this assay can detect is 250 copies / mL. A negative result does not preclude SARS-CoV-2 infection and should not be used as the sole basis for treatment or other patient management decisions.  A negative result may occur with improper specimen collection / handling, submission of specimen other than nasopharyngeal swab, presence of viral mutation(s) within the areas targeted by this assay, and inadequate number of viral copies (<250 copies / mL). A negative result must be combined with clinical observations,  patient history, and epidemiological information. Fact Sheet for Patients:   BoilerBrush.com.cy Fact Sheet for Healthcare Providers: https://pope.com/ This test is not yet approved or cleared  by the [place] FDA and has been authorized for detection and/or diagnosis of SARS-CoV-2 by FDA under an Emergency Use Authorization (EUA).  This EUA will remain in effect (meaning this test can be used) for the duration of the COVID-19 declaration under Section 564(b)(1) of the Act, 21 U.S.C. section 360bbb-3(b)(1), unless the authorization is terminated or revoked sooner. Performed at [facility], [address] [place], [place] [address]      Time coordinating discharge: Over 30 minutes  SIGNED:   Charise Killian, MD  [facility] 11/03/2019, 11:24 AM Pager   If 7PM-7AM, please contact night-coverage [email or web address]

## 2019-11-05 ENCOUNTER — Emergency Department
Admission: EM | Admit: 2019-11-05 | Discharge: 2019-11-05 | Disposition: A | Discharge status: Home / Self Care | Payer: Self-pay | Attending: Emergency Medicine | Admitting: Emergency Medicine

## 2019-11-05 ENCOUNTER — Other Ambulatory Visit: Payer: Self-pay

## 2019-11-05 ENCOUNTER — Telehealth: Payer: Self-pay | Admitting: Emergency Medicine

## 2019-11-05 ENCOUNTER — Encounter: Payer: Self-pay | Admitting: Emergency Medicine

## 2019-11-05 DIAGNOSIS — K5900 Constipation, unspecified: Secondary | ICD-10-CM | POA: Insufficient documentation

## 2019-11-05 DIAGNOSIS — R109 Unspecified abdominal pain: Secondary | ICD-10-CM | POA: Insufficient documentation

## 2019-11-05 DIAGNOSIS — Z5321 Procedure and treatment not carried out due to patient leaving prior to being seen by health care provider: Secondary | ICD-10-CM | POA: Insufficient documentation

## 2019-11-05 HISTORY — DX: Acute myocardial infarction, unspecified: I21.9

## 2019-11-05 LAB — CBC
HCT: 50.8 % (ref 39.0–52.0)
Hemoglobin: 16.9 g/dL (ref 13.0–17.0)
MCH: 31.5 pg (ref 26.0–34.0)
MCHC: 33.3 g/dL (ref 30.0–36.0)
MCV: 94.6 fL (ref 80.0–100.0)
Platelets: 172 10*3/uL (ref 150–400)
RBC: 5.37 MIL/uL (ref 4.22–5.81)
RDW: 12.8 % (ref 11.5–15.5)
WBC: 5.6 10*3/uL (ref 4.0–10.5)
nRBC: 0 % (ref 0.0–0.2)

## 2019-11-05 LAB — BASIC METABOLIC PANEL
Anion gap: 6 (ref 5–15)
BUN: 18 mg/dL (ref 8–23)
CO2: 21 mmol/L — ABNORMAL LOW (ref 22–32)
Calcium: 8.1 mg/dL — ABNORMAL LOW (ref 8.9–10.3)
Chloride: 109 mmol/L (ref 98–111)
Creatinine, Ser: 1.14 mg/dL (ref 0.61–1.24)
GFR calc Af Amer: >60 mL/min (ref >60)
GFR calc non Af Amer: >60 mL/min (ref >60)
Glucose, Bld: 109 mg/dL — ABNORMAL HIGH (ref 70–99)
Potassium: 4.6 mmol/L (ref 3.5–5.1)
Sodium: 136 mmol/L (ref 135–145)

## 2019-11-05 LAB — TROPONIN I (HIGH SENSITIVITY): Troponin I (High Sensitivity): 2734 ng/L (ref <18)

## 2019-11-05 NOTE — ED Triage Notes (Signed)
Patient states that he was admitted here Tuesday for an MI. Patient reports that he is having abdominal pain and constipated. Patient reports that his last BM was Friday.

## 2019-11-05 NOTE — ED Notes (Signed)
Pt called 4x with no response

## 2019-11-05 NOTE — Telephone Encounter (Signed)
Called patient due to lwot to inquire about condition and follow up plans.No answer and voicemail is full. °

## 2019-11-19 ENCOUNTER — Ambulatory Visit: Payer: Self-pay | Admitting: Pharmacy Technician

## 2019-11-19 ENCOUNTER — Other Ambulatory Visit: Payer: Self-pay

## 2019-11-19 DIAGNOSIS — Z79899 Other long term (current) drug therapy: Secondary | ICD-10-CM

## 2019-11-19 NOTE — Progress Notes (Signed)
Completed [facility] application and contract.  Patient agreed to all terms of the [facility] contract.    Patient approved to receive medication assistance at [facility] until time for re-certification in 9943, and as long as eligibility criteria continues to be met.    Provided patient with Civil engineer, contracting based on his particular needs.    Patient requested medications be transferred from [facility] to [place] to [facility]  [facility]

## 2019-11-21 ENCOUNTER — Other Ambulatory Visit: Payer: Self-pay | Admitting: Cardiology

## 2020-01-13 ENCOUNTER — Emergency Department
Admission: EM | Admit: 2020-01-13 | Discharge: 2020-01-13 | Disposition: A | Discharge status: Home / Self Care | Payer: Self-pay | Attending: Emergency Medicine | Admitting: Emergency Medicine

## 2020-01-13 DIAGNOSIS — Z5321 Procedure and treatment not carried out due to patient leaving prior to being seen by health care provider: Secondary | ICD-10-CM | POA: Insufficient documentation

## 2020-01-13 DIAGNOSIS — R1110 Vomiting, unspecified: Secondary | ICD-10-CM | POA: Insufficient documentation

## 2020-01-13 NOTE — ED Triage Notes (Signed)
RN getting ready to triage pt and he reports he vomited at work after eating a biscuit and they wanted him to get checked out but it was the biscuit and after he passed gas he felt fine. Pt does not really want to be seen at this time. Encouraged to return if needed, alert and oriented, NAD

## 2020-01-18 ENCOUNTER — Emergency Department: Payer: Self-pay

## 2020-01-18 ENCOUNTER — Encounter: Payer: Self-pay | Admitting: Emergency Medicine

## 2020-01-18 ENCOUNTER — Emergency Department
Admission: EM | Admit: 2020-01-18 | Discharge: 2020-01-18 | Disposition: A | Discharge status: Home / Self Care | Payer: Self-pay | Attending: Student in an Organized Health Care Education/Training Program | Admitting: Student in an Organized Health Care Education/Training Program

## 2020-01-18 ENCOUNTER — Other Ambulatory Visit: Payer: Self-pay

## 2020-01-18 DIAGNOSIS — R1011 Right upper quadrant pain: Secondary | ICD-10-CM | POA: Insufficient documentation

## 2020-01-18 DIAGNOSIS — Z7982 Long term (current) use of aspirin: Secondary | ICD-10-CM | POA: Insufficient documentation

## 2020-01-18 DIAGNOSIS — I252 Old myocardial infarction: Secondary | ICD-10-CM | POA: Insufficient documentation

## 2020-01-18 DIAGNOSIS — Z20822 Contact with and (suspected) exposure to covid-19: Secondary | ICD-10-CM | POA: Insufficient documentation

## 2020-01-18 DIAGNOSIS — F17210 Nicotine dependence, cigarettes, uncomplicated: Secondary | ICD-10-CM | POA: Insufficient documentation

## 2020-01-18 DIAGNOSIS — Z79899 Other long term (current) drug therapy: Secondary | ICD-10-CM | POA: Insufficient documentation

## 2020-01-18 LAB — URINALYSIS, COMPLETE (UACMP) WITH MICROSCOPIC
Bacteria, UA: NONE SEEN
Bilirubin Urine: NEGATIVE
Glucose, UA: NEGATIVE mg/dL
Hgb urine dipstick: NEGATIVE
Ketones, ur: NEGATIVE mg/dL
Leukocytes,Ua: NEGATIVE
Nitrite: NEGATIVE
Protein, ur: NEGATIVE mg/dL
Specific Gravity, Urine: 1.029 (ref 1.005–1.030)
pH: 5 (ref 5.0–8.0)

## 2020-01-18 LAB — CBC WITH DIFFERENTIAL/PLATELET
Abs Immature Granulocytes: 0.01 10*3/uL (ref 0.00–0.07)
Basophils Absolute: 0 10*3/uL (ref 0.0–0.1)
Basophils Relative: 1 %
Eosinophils Absolute: 0.2 10*3/uL (ref 0.0–0.5)
Eosinophils Relative: 3 %
HCT: 48.4 % (ref 39.0–52.0)
Hemoglobin: 16.7 g/dL (ref 13.0–17.0)
Immature Granulocytes: 0 %
Lymphocytes Relative: 23 %
Lymphs Abs: 1.4 10*3/uL (ref 0.7–4.0)
MCH: 32.4 pg (ref 26.0–34.0)
MCHC: 34.5 g/dL (ref 30.0–36.0)
MCV: 94 fL (ref 80.0–100.0)
Monocytes Absolute: 0.4 10*3/uL (ref 0.1–1.0)
Monocytes Relative: 7 %
Neutro Abs: 4.1 10*3/uL (ref 1.7–7.7)
Neutrophils Relative %: 66 %
Platelets: 179 10*3/uL (ref 150–400)
RBC: 5.15 MIL/uL (ref 4.22–5.81)
RDW: 13.8 % (ref 11.5–15.5)
WBC: 6 10*3/uL (ref 4.0–10.5)
nRBC: 0 % (ref 0.0–0.2)

## 2020-01-18 LAB — COMPREHENSIVE METABOLIC PANEL
ALT: 44 U/L (ref 0–44)
AST: 24 U/L (ref 15–41)
Albumin: 3.5 g/dL (ref 3.5–5.0)
Alkaline Phosphatase: 113 U/L (ref 38–126)
Anion gap: 8 (ref 5–15)
BUN: 12 mg/dL (ref 8–23)
CO2: 23 mmol/L (ref 22–32)
Calcium: 8.1 mg/dL — ABNORMAL LOW (ref 8.9–10.3)
Chloride: 109 mmol/L (ref 98–111)
Creatinine, Ser: 1.11 mg/dL (ref 0.61–1.24)
GFR calc Af Amer: >60 mL/min (ref >60)
GFR calc non Af Amer: >60 mL/min (ref >60)
Glucose, Bld: 140 mg/dL — ABNORMAL HIGH (ref 70–99)
Potassium: 3.7 mmol/L (ref 3.5–5.1)
Sodium: 140 mmol/L (ref 135–145)
Total Bilirubin: 0.9 mg/dL (ref 0.3–1.2)
Total Protein: 6.7 g/dL (ref 6.5–8.1)

## 2020-01-18 LAB — SARS CORONAVIRUS 2 BY RT PCR (HOSPITAL ORDER, PERFORMED IN ~~LOC~~ HOSPITAL LAB): SARS Coronavirus 2: NEGATIVE

## 2020-01-18 LAB — LIPASE, BLOOD: Lipase: 28 U/L (ref 11–51)

## 2020-01-18 LAB — TROPONIN I (HIGH SENSITIVITY): Troponin I (High Sensitivity): 10 ng/L (ref <18)

## 2020-01-18 MED ORDER — MORPHINE SULFATE (PF) 4 MG/ML IV SOLN
4.0000 mg | INTRAVENOUS | Status: DC | PRN
  Administered 2020-01-18: 4 mg via INTRAVENOUS
  Filled 2020-01-18: qty 1

## 2020-01-18 MED ORDER — ONDANSETRON HCL 4 MG/2ML IJ SOLN
4.0000 mg | Freq: Once | INTRAMUSCULAR | Status: AC
  Administered 2020-01-18: 4 mg via INTRAVENOUS
  Filled 2020-01-18: qty 2

## 2020-01-18 MED ORDER — HYDROCODONE-ACETAMINOPHEN 5-325 MG PO TABS: 1.0000 | ORAL_TABLET | ORAL | 0 refills | Status: DC | PRN

## 2020-01-18 MED ORDER — SODIUM CHLORIDE 0.9 % IV BOLUS
500.0000 mL | Freq: Once | INTRAVENOUS | Status: AC
  Administered 2020-01-18: 500 mL via INTRAVENOUS

## 2020-01-18 MED ORDER — IOHEXOL 300 MG/ML  SOLN
100.0000 mL | Freq: Once | INTRAMUSCULAR | Status: AC | PRN
  Administered 2020-01-18: 100 mL via INTRAVENOUS

## 2020-01-18 MED ORDER — HYDROCODONE-ACETAMINOPHEN 5-325 MG PO TABS: 1.0000 | ORAL_TABLET | ORAL | 0 refills | Status: AC | PRN

## 2020-01-18 NOTE — ED Triage Notes (Signed)
Patient ambulatory to triage with steady gait, without difficulty or distress noted; pt reports rt upper abd pain, nonradiating x week; denies any accomp symptoms; here recently for same but left before being seen due to long wait

## 2020-01-18 NOTE — Discharge Instructions (Signed)

## 2020-01-18 NOTE — ED Provider Notes (Signed)
[facility] Emergency Department Provider Note    First MD Initiated Contact with Patient 01/18/20 0830     (approximate)  I have reviewed the triage vital signs and the nursing notes.   HISTORY  Chief Complaint Abdominal Pain    HPI Matthew Atkinson is a 59 y.o. male with close past medical history presents to the ER for evaluation of little less than a week of progressively worsening right-sided abdominal pain associated with initially constipation but a few episodes of watery nonbloody nonmelanotic diarrhea.  Denies any chest pain or shortness of breath.  Feels like his stomach is distended.  Denies any radiation of pain to his shoulder or through to the back.  Not had any previous abdominal surgeries.    Past Medical History:  Diagnosis Date  . MI (myocardial infarction) (HCC)    Family History  Problem Relation Age of Onset  . Diabetes Father    Past Surgical History:  Procedure Laterality Date  . CORONARY STENT INTERVENTION N/A 11/01/2019   Procedure: CORONARY STENT INTERVENTION;  Surgeon: Marcina Millard, MD;  Location: [facility] INVASIVE CV LAB;  Service: Cardiovascular;  Laterality: N/A;  . CORONARY/GRAFT ACUTE MI REVASCULARIZATION N/A 11/01/2019   Procedure: Coronary/Graft Acute MI Revascularization;  Surgeon: Marcina Millard, MD;  Location: [facility] INVASIVE CV LAB;  Service: Cardiovascular;  Laterality: N/A;  . LEFT HEART CATH AND CORONARY ANGIOGRAPHY N/A 11/01/2019   Procedure: LEFT HEART CATH AND CORONARY ANGIOGRAPHY;  Surgeon: Marcina Millard, MD;  Location: [facility] INVASIVE CV LAB;  Service: Cardiovascular;  Laterality: N/A;   Patient Active Problem List   Diagnosis Date Noted  . Acute ST elevation myocardial infarction (STEMI) of posterior wall (HCC) 11/01/2019  . STEMI (ST elevation myocardial infarction) (HCC) 11/01/2019      Prior to Admission medications   Medication Sig Start Date End Date Taking? Authorizing Provider    aspirin 81 MG chewable tablet Chew 1 tablet (81 mg total) by mouth daily. 11/04/19   Charise Killian, MD  atorvastatin (LIPITOR) 80 MG tablet Take 1 tablet (80 mg total) by mouth daily. 11/04/19 12/04/19  Charise Killian, MD  lisinopril (ZESTRIL) 5 MG tablet Take 1 tablet (5 mg total) by mouth daily. 11/04/19 12/04/19  Charise Killian, MD  metoprolol tartrate (LOPRESSOR) 50 MG tablet Take 1 tablet (50 mg total) by mouth 2 (two) times daily. 11/03/19 12/03/19  Charise Killian, MD  hydrochlorothiazide (MICROZIDE) 12.5 MG capsule Take 1 capsule (12.5 mg total) by mouth daily. 06/02/16 05/20/19  Hagler, Jami L, PA-C    Allergies Patient has no known allergies.    Social History Social History   Tobacco Use  . Smoking status: Current Every Day Smoker    Packs/day: 0.50    Types: Cigarettes  . Smokeless tobacco: Never Used  Vaping Use  . Vaping Use: Never used  Substance Use Topics  . Alcohol use: Yes    Comment: occasionally  . Drug use: No    Review of Systems Patient denies headaches, rhinorrhea, blurry vision, numbness, shortness of breath, chest pain, edema, cough, abdominal pain, nausea, vomiting, diarrhea, dysuria, fevers, rashes or hallucinations unless otherwise stated above in HPI. ____________________________________________   PHYSICAL EXAM:  VITAL SIGNS: Vitals:   01/18/20 0353  BP: (!) 159/87  Pulse: 60  Resp: 18  Temp: 98.2 F (36.8 C)  SpO2: 97%    Constitutional: Alert and oriented.  Eyes: Conjunctivae are normal.  Head: Atraumatic. Nose: No congestion/rhinnorhea. Mouth/Throat: Mucous membranes are moist.  Neck: No stridor. Painless ROM.  Cardiovascular: Normal rate, regular rhythm. Grossly normal heart sounds.  Good peripheral circulation. Respiratory: Normal respiratory effort.  No retractions. Lungs CTAB. Gastrointestinal: Soft but with ttp along entire right side of abd. No distention. No abdominal bruits. No CVA  tenderness. Genitourinary:  Musculoskeletal: No lower extremity tenderness nor edema.  No joint effusions. Neurologic:  Normal speech and language. No gross focal neurologic deficits are appreciated. No facial droop Skin:  Skin is warm, dry and intact. No rash noted. Psychiatric: Mood and affect are normal. Speech and behavior are normal.  ____________________________________________   LABS (all labs ordered are listed, but only abnormal results are displayed)  Results for orders placed or performed during the hospital encounter of 01/18/20 (from the past 24 hour(s))  CBC with Differential     Status: None   Collection Time: 01/18/20  3:56 AM  Result Value Ref Range   WBC 6.0 4.0 - 10.5 K/uL   RBC 5.15 4.22 - 5.81 MIL/uL   Hemoglobin 16.7 13.0 - 17.0 g/dL   HCT 65.4 39 - 52 %   MCV 94.0 80.0 - 100.0 fL   MCH 32.4 26.0 - 34.0 pg   MCHC 34.5 30.0 - 36.0 g/dL   RDW 65.0 35.4 - 65.6 %   Platelets 179 150 - 400 K/uL   nRBC 0.0 0.0 - 0.2 %   Neutrophils Relative % 66 %   Neutro Abs 4.1 1.7 - 7.7 K/uL   Lymphocytes Relative 23 %   Lymphs Abs 1.4 0.7 - 4.0 K/uL   Monocytes Relative 7 %   Monocytes Absolute 0.4 0 - 1 K/uL   Eosinophils Relative 3 %   Eosinophils Absolute 0.2 0 - 0 K/uL   Basophils Relative 1 %   Basophils Absolute 0.0 0 - 0 K/uL   Immature Granulocytes 0 %   Abs Immature Granulocytes 0.01 0.00 - 0.07 K/uL  Comprehensive metabolic panel     Status: Abnormal   Collection Time: 01/18/20  3:56 AM  Result Value Ref Range   Sodium 140 135 - 145 mmol/L   Potassium 3.7 3.5 - 5.1 mmol/L   Chloride 109 98 - 111 mmol/L   CO2 23 22 - 32 mmol/L   Glucose, Bld 140 (H) 70 - 99 mg/dL   BUN 12 8 - 23 mg/dL   Creatinine, Ser 8.12 0.61 - 1.24 mg/dL   Calcium 8.1 (L) 8.9 - 10.3 mg/dL   Total Protein 6.7 6.5 - 8.1 g/dL   Albumin 3.5 3.5 - 5.0 g/dL   AST 24 15 - 41 U/L   ALT 44 0 - 44 U/L   Alkaline Phosphatase 113 38 - 126 U/L   Total Bilirubin 0.9 0.3 - 1.2 mg/dL   GFR calc  non Af Amer >60 >60 mL/min   GFR calc Af Amer >60 >60 mL/min   Anion gap 8 5 - 15  Lipase, blood     Status: None   Collection Time: 01/18/20  3:56 AM  Result Value Ref Range   Lipase 28 11 - 51 U/L  Troponin I (High Sensitivity)     Status: None   Collection Time: 01/18/20  3:56 AM  Result Value Ref Range   Troponin I (High Sensitivity) 10 <18 ng/L  Urinalysis, Complete w Microscopic     Status: Abnormal   Collection Time: 01/18/20  3:57 AM  Result Value Ref Range   Color, Urine YELLOW (A) YELLOW   APPearance HAZY (A) CLEAR  Specific Gravity, Urine 1.029 1.005 - 1.030   pH 5.0 5.0 - 8.0   Glucose, UA NEGATIVE NEGATIVE mg/dL   Hgb urine dipstick NEGATIVE NEGATIVE   Bilirubin Urine NEGATIVE NEGATIVE   Ketones, ur NEGATIVE NEGATIVE mg/dL   Protein, ur NEGATIVE NEGATIVE mg/dL   Nitrite NEGATIVE NEGATIVE   Leukocytes,Ua NEGATIVE NEGATIVE   RBC / HPF 0-5 0 - 5 RBC/hpf   WBC, UA 0-5 0 - 5 WBC/hpf   Bacteria, UA NONE SEEN NONE SEEN   Squamous Epithelial / LPF 0-5 0 - 5   Mucus PRESENT    ____________________________________________  EKG My review and personal interpretation at Time: 3:47   Indication: abd pain  Rate: 60  Rhythm: sinus Axis: normal Other: no stemi, no depression ____________________________________________  RADIOLOGY  I personally reviewed all radiographic images ordered to evaluate for the above acute complaints and reviewed radiology reports and findings.  These findings were personally discussed with the patient.  Please see medical record for radiology report.  ____________________________________________   PROCEDURES  Procedure(s) performed:  Procedures    Critical Care performed: no ____________________________________________   INITIAL IMPRESSION / ASSESSMENT AND PLAN / ED COURSE  Pertinent labs & imaging results that were available during my care of the patient were reviewed by me and considered in my medical decision making (see chart for  details).   DDX: appendicitis, diverticulitis, cholelithiasis, cholecystitis, colitis, msk strain  Matthew Atkinson is a 59 y.o. who presents to the ED with right-sided abdominal pain as described above.  States has been progressively worsening for the past week and feel it is related to constipation but has been moving his bowels and is on MiraLAX.  Denies any heavy lifting.  Given location of pain will order CT imaging.  His blood work is otherwise reassuring.  No cardiac symptoms.  The patient will be placed on continuous pulse oximetry and telemetry for monitoring.  Laboratory evaluation will be sent to evaluate for the above complaints.     Clinical Course as of Jan 17 1201  Fri Jan 18, 2020  1149 Patient's work-up is reassuring.  No sign of cystitis urinalysis.  Suspect musculoskeletal strain as his work-up is thus far reassuring.  I do believe he is appropriate for further work-up of these symptoms as an outpatient.  We discussed signs and symptoms for which he should return to the ER.   [PR]    Clinical Course User Index [PR] Willy Eddy, MD    The patient was evaluated in Emergency Department today for the symptoms described in the history of present illness. He/she was evaluated in the context of the global COVID-19 pandemic, which necessitated consideration that the patient might be at risk for infection with the SARS-CoV-2 virus that causes COVID-19. Institutional protocols and algorithms that pertain to the evaluation of patients at risk for COVID-19 are in a state of rapid change based on information released by regulatory bodies including the CDC and federal and state organizations. These policies and algorithms were followed during the patient's care in the ED.  As part of my medical decision making, I reviewed the following data within the electronic MEDICAL RECORD NUMBER Nursing notes reviewed and incorporated, Labs reviewed, notes from prior ED visits and [place] Controlled Substance  Database   ____________________________________________   FINAL CLINICAL IMPRESSION(S) / ED DIAGNOSES  Final diagnoses:  RUQ abdominal pain      NEW MEDICATIONS STARTED DURING THIS VISIT:  New Prescriptions   No medications on file  Note:  This document was prepared using Dragon voice recognition software and may include unintentional dictation errors.    Willy Eddyobinson, Agapita Savarino, MD 01/18/20 1202

## 2020-03-19 ENCOUNTER — Other Ambulatory Visit: Payer: Self-pay | Admitting: Physician Assistant

## 2020-10-07 ENCOUNTER — Other Ambulatory Visit: Payer: Self-pay | Admitting: Cardiology

## 2020-10-07 ENCOUNTER — Other Ambulatory Visit: Payer: Self-pay

## 2020-10-07 MED FILL — Lisinopril Tab 5 MG: ORAL | 90 days supply | Qty: 90 | Fill #0 | Status: AC

## 2020-10-07 MED FILL — Atorvastatin Calcium Tab 40 MG (Base Equivalent): ORAL | 90 days supply | Qty: 180 | Fill #0 | Status: AC

## 2020-10-07 MED FILL — Aspirin Tab Delayed Release 81 MG: ORAL | 90 days supply | Qty: 90 | Fill #0 | Status: AC

## 2020-10-08 ENCOUNTER — Other Ambulatory Visit: Payer: Self-pay

## 2020-10-08 MED ORDER — CLOPIDOGREL BISULFATE 75 MG PO TABS
ORAL_TABLET | Freq: Every day | ORAL | 3 refills | Status: DC
  Filled 2020-10-08: qty 90, 90d supply, fill #0
  Filled 2021-03-04: qty 30, 30d supply, fill #1

## 2020-10-10 ENCOUNTER — Other Ambulatory Visit: Payer: Self-pay

## 2020-10-23 ENCOUNTER — Other Ambulatory Visit: Payer: Self-pay

## 2020-11-21 ENCOUNTER — Other Ambulatory Visit: Payer: Self-pay

## 2020-11-24 ENCOUNTER — Other Ambulatory Visit: Payer: Self-pay

## 2020-11-24 MED ORDER — ATORVASTATIN CALCIUM 40 MG PO TABS
ORAL_TABLET | ORAL | 0 refills | Status: DC
  Filled 2020-11-24: qty 180, fill #0
  Filled 2021-03-04: qty 60, 30d supply, fill #0

## 2020-11-24 MED ORDER — LISINOPRIL 5 MG PO TABS
ORAL_TABLET | Freq: Every day | ORAL | 0 refills | Status: DC
  Filled 2020-11-24: qty 90, fill #0
  Filled 2021-03-04: qty 30, 30d supply, fill #0

## 2020-11-24 MED ORDER — ASPIRIN 81 MG PO TBEC
81.0000 mg | DELAYED_RELEASE_TABLET | Freq: Every day | ORAL | 0 refills | Status: DC
  Filled 2020-11-24: qty 90, 90d supply, fill #0
  Filled 2021-03-04: qty 30, 30d supply, fill #0

## 2020-12-24 IMAGING — US US ABDOMEN LIMITED
1 series · 14 of 25 positions shown · non-contrast
Comparison: None.

CLINICAL DATA: Right upper quadrant pain for 1 week

EXAM:
ULTRASOUND ABDOMEN LIMITED RIGHT UPPER QUADRANT

[Series 1: us abdomen limited ruq · 14 of 67 slices shown]
[im 1/67]
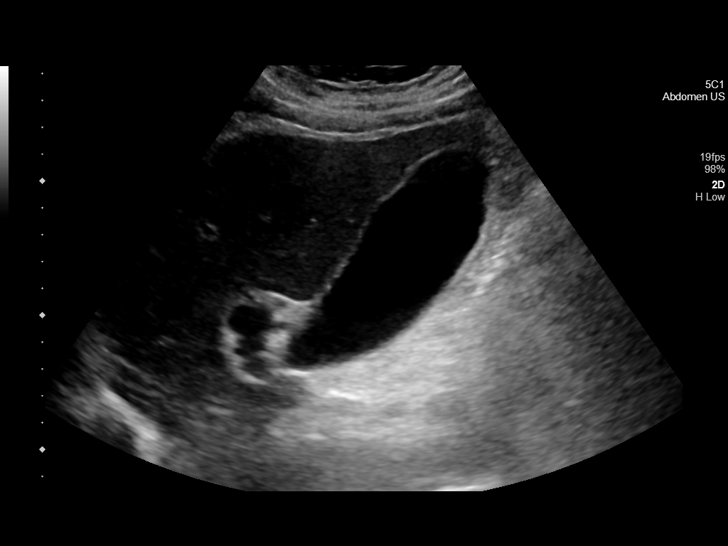
[im 6/67]
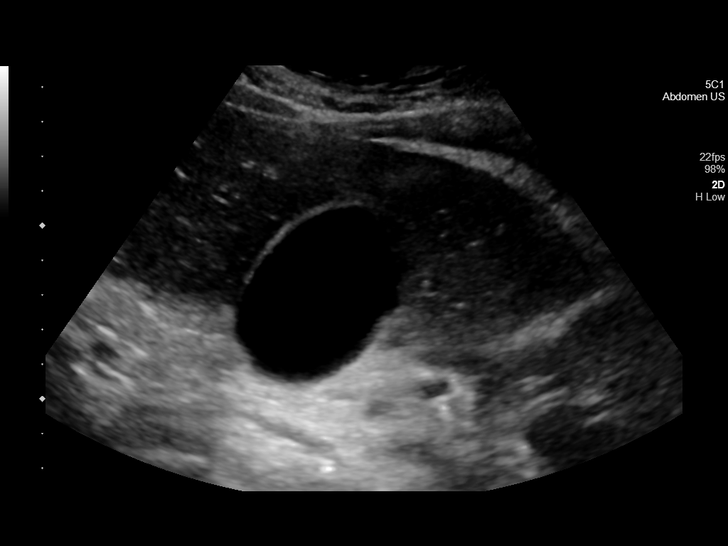
[im 12/67]
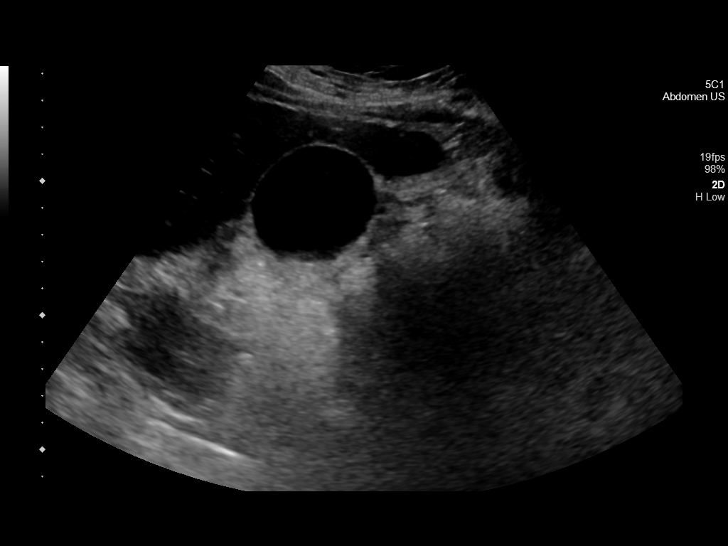
[im 17/67]
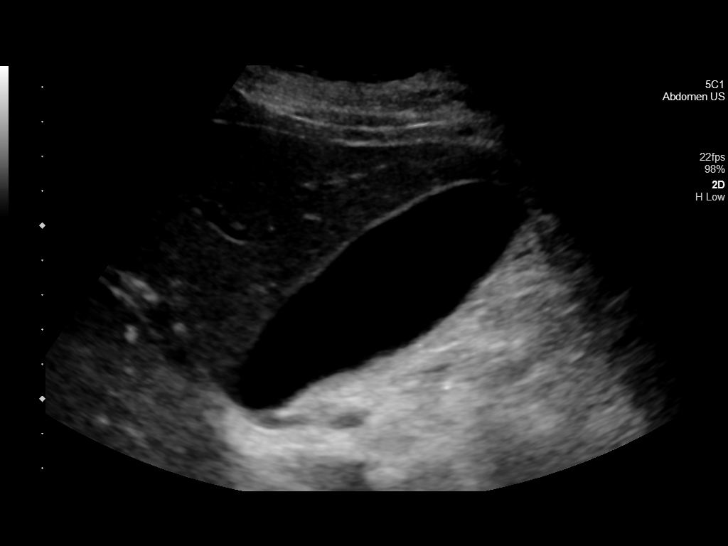
[im 23/67]
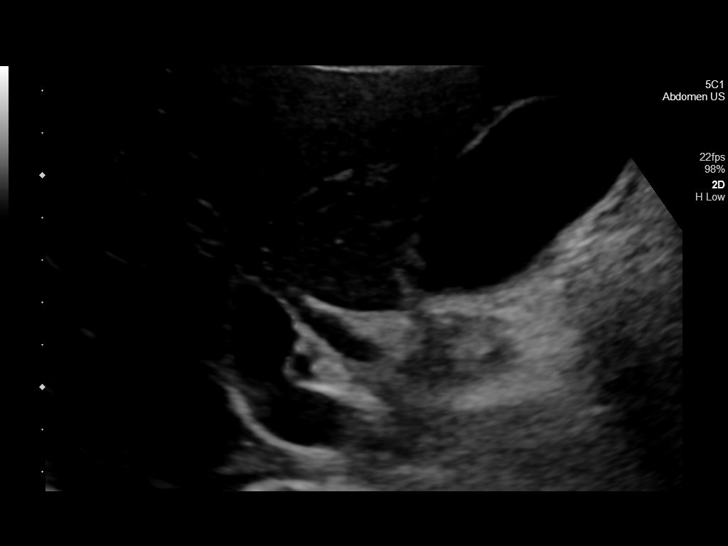
[im 25/67]
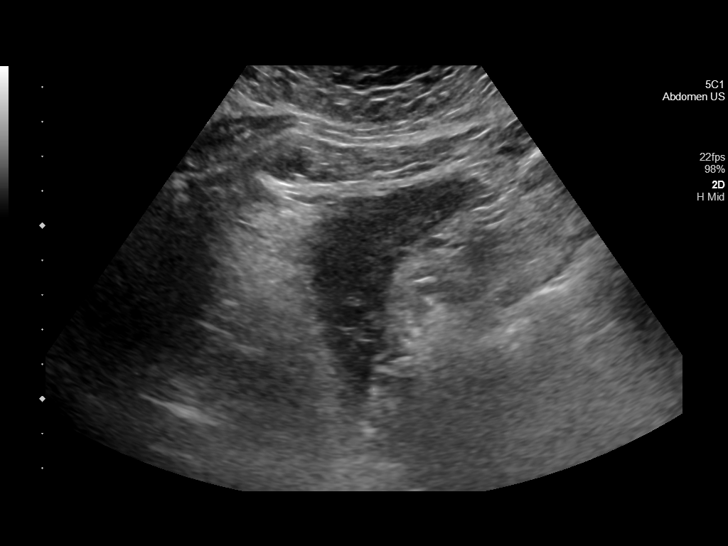
[im 31/67]
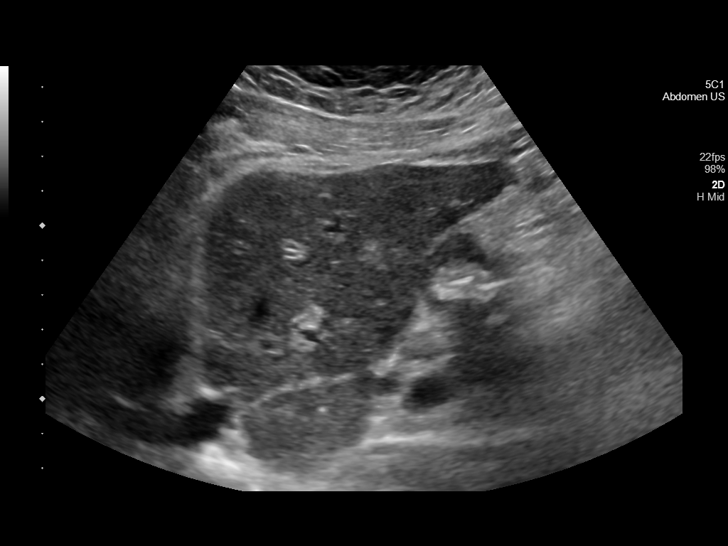
[im 36/67]
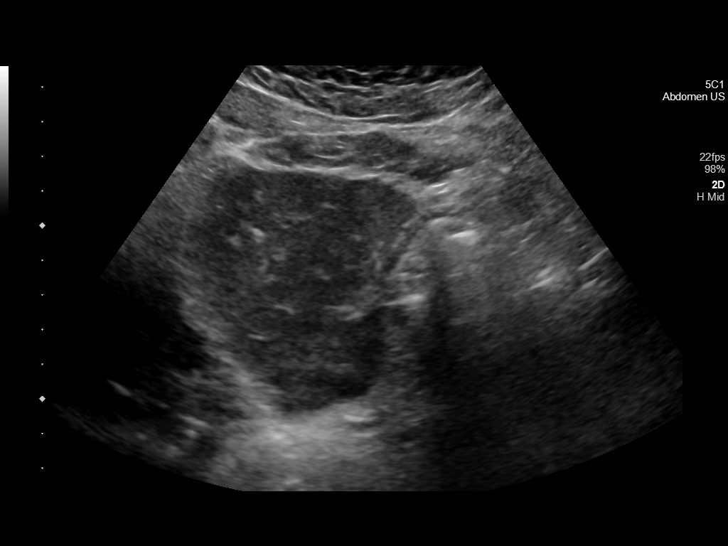
[im 42/67]
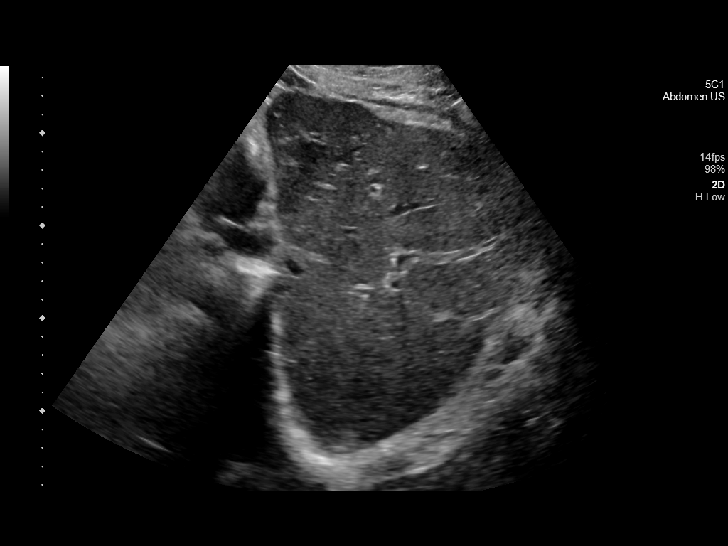
[im 45/67]
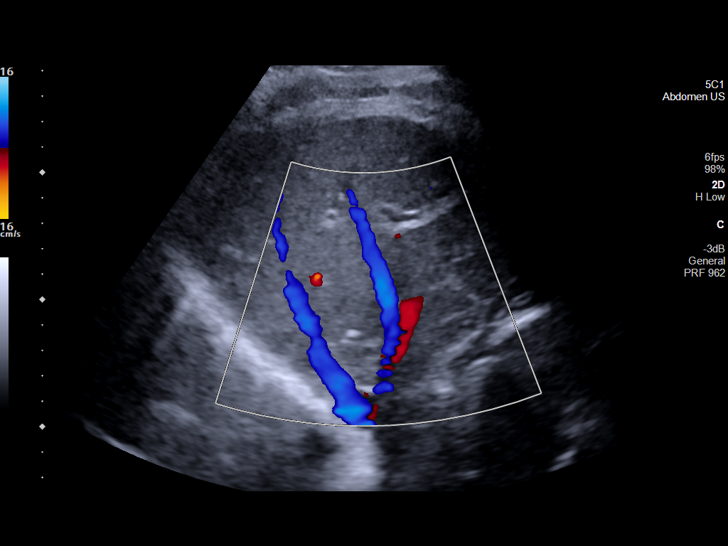
[im 50/67]
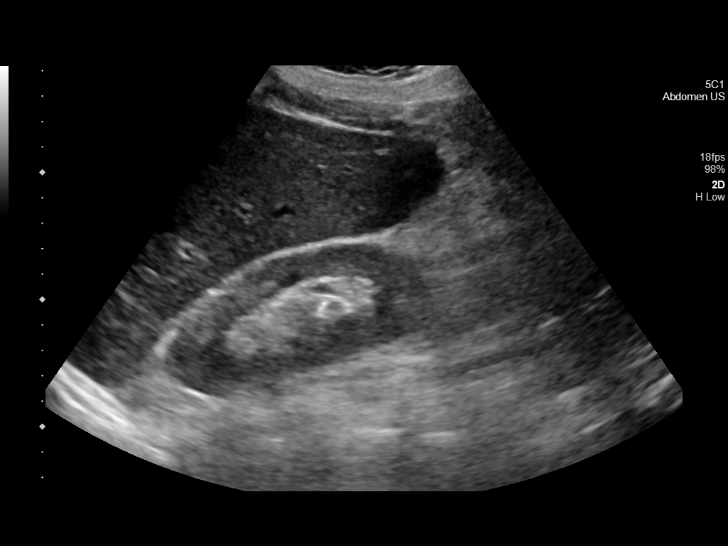
[im 56/67]
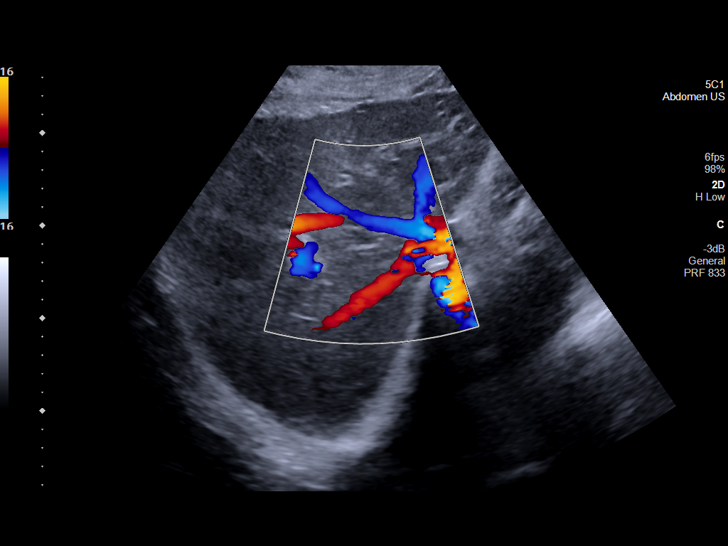
[im 61/67]
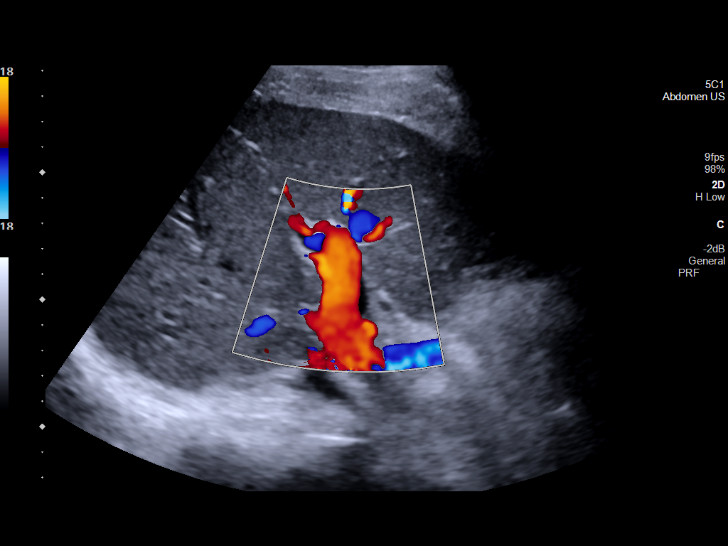
[im 67/67]
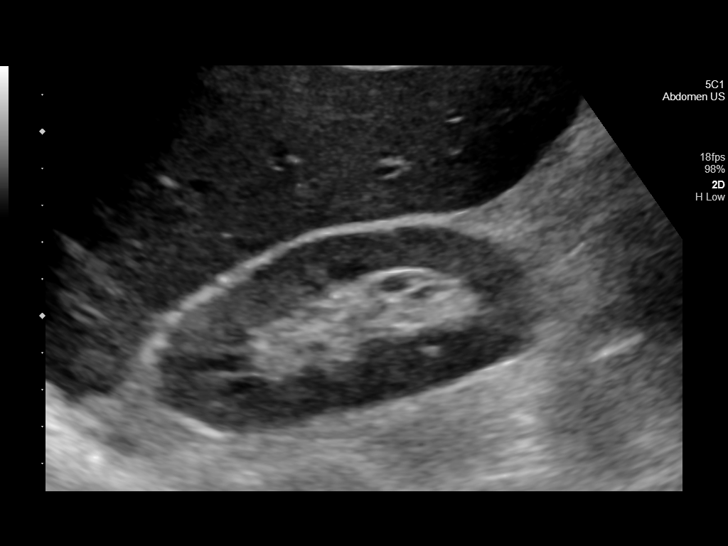

[14 of 25 positions shown; findings below may reference images not displayed]

FINDINGS: Gallbladder:

No gallstones or wall thickening visualized. No sonographic Murphy
sign noted by sonographer.

Common bile duct:

Diameter: 6 mm

Liver:

No focal lesion identified. Within normal limits in parenchymal
echogenicity. Portal vein is patent on color Doppler imaging with
normal direction of blood flow towards the liver.

Other: None.
IMPRESSION: No acute abnormality noted.

## 2021-01-02 ENCOUNTER — Other Ambulatory Visit: Payer: Self-pay

## 2021-02-25 ENCOUNTER — Other Ambulatory Visit: Payer: Self-pay

## 2021-03-02 ENCOUNTER — Other Ambulatory Visit: Payer: Self-pay

## 2021-03-04 ENCOUNTER — Ambulatory Visit: Payer: Self-pay | Admitting: Pharmacy Technician

## 2021-03-04 ENCOUNTER — Other Ambulatory Visit: Payer: Self-pay

## 2021-03-04 DIAGNOSIS — Z79899 Other long term (current) drug therapy: Secondary | ICD-10-CM

## 2021-03-04 NOTE — Progress Notes (Signed)
Completed [facility] application and contract for re-certification.  Patient agreed to all terms of the [facility] contract.    Patient to provide 2021 [employer].    Filling 30 days of medications for patient.  Patient to provide 2021 Federal Income Taxes before additional medication assistance can be provided.  Provided patient with Community education officer based on his particular needs.    Sherilyn Dacosta Care Manager [facility]

## 2021-03-05 ENCOUNTER — Other Ambulatory Visit: Payer: Self-pay

## 2021-03-06 ENCOUNTER — Other Ambulatory Visit: Payer: Self-pay

## 2021-03-10 ENCOUNTER — Other Ambulatory Visit: Payer: Self-pay | Admitting: Internal Medicine

## 2021-03-10 ENCOUNTER — Other Ambulatory Visit: Payer: Self-pay

## 2021-03-10 ENCOUNTER — Telehealth: Payer: Self-pay | Admitting: Pharmacy Technician

## 2021-03-10 NOTE — Telephone Encounter (Signed)
Received updated proof of income.  Patient eligible to receive medication assistance at [facility] until time for re-certification in 2023, and as long as eligibility requirements continue to be met.  Matthew Atkinson J. [name] Care Manager [facility]  

## 2021-03-12 ENCOUNTER — Other Ambulatory Visit: Payer: Self-pay

## 2021-03-16 ENCOUNTER — Other Ambulatory Visit: Payer: Self-pay

## 2021-03-22 ENCOUNTER — Emergency Department
Admission: EM | Admit: 2021-03-22 | Discharge: 2021-03-23 | Disposition: A | Discharge status: Home / Self Care | Payer: Self-pay | Attending: Emergency Medicine | Admitting: Emergency Medicine

## 2021-03-22 ENCOUNTER — Other Ambulatory Visit: Payer: Self-pay

## 2021-03-22 ENCOUNTER — Emergency Department: Payer: Self-pay

## 2021-03-22 DIAGNOSIS — F17210 Nicotine dependence, cigarettes, uncomplicated: Secondary | ICD-10-CM | POA: Insufficient documentation

## 2021-03-22 DIAGNOSIS — Z7982 Long term (current) use of aspirin: Secondary | ICD-10-CM | POA: Insufficient documentation

## 2021-03-22 DIAGNOSIS — Z955 Presence of coronary angioplasty implant and graft: Secondary | ICD-10-CM | POA: Insufficient documentation

## 2021-03-22 DIAGNOSIS — R600 Localized edema: Secondary | ICD-10-CM | POA: Insufficient documentation

## 2021-03-22 DIAGNOSIS — R0602 Shortness of breath: Secondary | ICD-10-CM | POA: Insufficient documentation

## 2021-03-22 LAB — CBC
HCT: 46.9 % (ref 39.0–52.0)
Hemoglobin: 15.9 g/dL (ref 13.0–17.0)
MCH: 33.3 pg (ref 26.0–34.0)
MCHC: 33.9 g/dL (ref 30.0–36.0)
MCV: 98.3 fL (ref 80.0–100.0)
Platelets: 153 10*3/uL (ref 150–400)
RBC: 4.77 MIL/uL (ref 4.22–5.81)
RDW: 13.1 % (ref 11.5–15.5)
WBC: 4.2 10*3/uL (ref 4.0–10.5)
nRBC: 0 % (ref 0.0–0.2)

## 2021-03-22 LAB — COMPREHENSIVE METABOLIC PANEL
ALT: 16 U/L (ref 0–44)
AST: 21 U/L (ref 15–41)
Albumin: 3.2 g/dL — ABNORMAL LOW (ref 3.5–5.0)
Alkaline Phosphatase: 65 U/L (ref 38–126)
Anion gap: 4 — ABNORMAL LOW (ref 5–15)
BUN: 17 mg/dL (ref 8–23)
CO2: 27 mmol/L (ref 22–32)
Calcium: 8.3 mg/dL — ABNORMAL LOW (ref 8.9–10.3)
Chloride: 106 mmol/L (ref 98–111)
Creatinine, Ser: 1.23 mg/dL (ref 0.61–1.24)
GFR, Estimated: >60 mL/min (ref >60)
Glucose, Bld: 95 mg/dL (ref 70–99)
Potassium: 4.6 mmol/L (ref 3.5–5.1)
Sodium: 137 mmol/L (ref 135–145)
Total Bilirubin: 0.6 mg/dL (ref 0.3–1.2)
Total Protein: 6.6 g/dL (ref 6.5–8.1)

## 2021-03-22 LAB — TROPONIN I (HIGH SENSITIVITY): Troponin I (High Sensitivity): 15 ng/L (ref <18)

## 2021-03-22 LAB — BRAIN NATRIURETIC PEPTIDE: B Natriuretic Peptide: 146.7 pg/mL — ABNORMAL HIGH (ref 0.0–100.0)

## 2021-03-22 MED ORDER — FUROSEMIDE 20 MG PO TABS
20.0000 mg | ORAL_TABLET | Freq: Every day | ORAL | 11 refills | Status: DC
  Filled 2021-03-22: qty 30, 30d supply, fill #0

## 2021-03-22 NOTE — ED Notes (Signed)
First contact: This RN assumes care, answers call light. Pt requesting urinal. Given urinal, able to void sans complications. Updated to plan of care. VS updated. Call light in reach. Denies further needs at this time.

## 2021-03-22 NOTE — ED Triage Notes (Signed)
Pt presents ambulatory to triage POV from home with c/o R leg swelling x2 weeks with SOB.   Pt denies any long car rides or prolonged immobilization.   On plavix.

## 2021-03-22 NOTE — ED Provider Notes (Signed)
[facility] Emergency Department Provider Note   ____________________________________________    I have reviewed the triage vital signs and the nursing notes.   HISTORY  Chief Complaint Leg Swelling     HPI Matthew Atkinson is a 60 y.o. male who presents with complaints of bilateral lower extremity swelling and some shortness of breath with exertion.  Patient reports this is been going on for several weeks now.  He denies chest pain at this time.  Does have a history of an MI in the past.  He reports he made an appointment with his cardiologist Dr. Darrold Junker on the 17th of this month.  Does not have a history of lower extremity swelling in the past.  Denies pain.  No redness.  No pleurisy  Past Medical History:  Diagnosis Date   MI (myocardial infarction) [facility])     Patient Active Problem List   Diagnosis Date Noted   Acute ST elevation myocardial infarction (STEMI) of posterior wall (HCC) 11/01/2019   STEMI (ST elevation myocardial infarction) (HCC) 11/01/2019    Past Surgical History:  Procedure Laterality Date   CORONARY STENT INTERVENTION N/A 11/01/2019   Procedure: CORONARY STENT INTERVENTION;  Surgeon: Marcina Millard, MD;  Location: [facility] INVASIVE CV LAB;  Service: Cardiovascular;  Laterality: N/A;   CORONARY/GRAFT ACUTE MI REVASCULARIZATION N/A 11/01/2019   Procedure: Coronary/Graft Acute MI Revascularization;  Surgeon: Marcina Millard, MD;  Location: [facility] INVASIVE CV LAB;  Service: Cardiovascular;  Laterality: N/A;   LEFT HEART CATH AND CORONARY ANGIOGRAPHY N/A 11/01/2019   Procedure: LEFT HEART CATH AND CORONARY ANGIOGRAPHY;  Surgeon: Marcina Millard, MD;  Location: [facility] INVASIVE CV LAB;  Service: Cardiovascular;  Laterality: N/A;    Prior to Admission medications   Medication Sig Start Date End Date Taking? Authorizing Provider  aspirin (ASPIRIN ADULT LOW STRENGTH) 81 MG EC tablet TAKE ONE TABLET BY MOUTH EVERY DAY 11/24/20  11/24/21    aspirin 81 MG chewable tablet Chew 1 tablet (81 mg total) by mouth daily. 11/04/19   Charise Killian, MD  atorvastatin (LIPITOR) 40 MG tablet TAKE 2 TABLETS (80MG ) BY MOUTH ONCE EVERY DAY. 11/24/20 11/24/21    atorvastatin (LIPITOR) 80 MG tablet Take 1 tablet (80 mg total) by mouth daily. 11/04/19 12/04/19  12/06/19, MD  clopidogrel (PLAVIX) 75 MG tablet TAKE ONE TABLET BY MOUTH ONCE EVERY DAY. 10/08/20 07/30/21  Paraschos, 08/01/21, MD  HYDROcodone-acetaminophen (NORCO) 5-325 MG tablet Take 1 tablet by mouth every 4 (four) hours as needed for moderate pain. 01/18/20   03/19/20, MD  lisinopril (ZESTRIL) 5 MG tablet Take 1 tablet (5 mg total) by mouth daily. 11/04/19 12/04/19  12/06/19, MD  lisinopril (ZESTRIL) 5 MG tablet TAKE ONE TABLET BY MOUTH ONCE EVERY DAY. 11/24/20 11/24/21    metoprolol tartrate (LOPRESSOR) 50 MG tablet Take 1 tablet (50 mg total) by mouth 2 (two) times daily. 11/03/19 12/03/19  12/05/19, MD  hydrochlorothiazide (MICROZIDE) 12.5 MG capsule Take 1 capsule (12.5 mg total) by mouth daily. 06/02/16 05/20/19  Hagler, Jami L, PA-C     Allergies Patient has no known allergies.  Family History  Problem Relation Age of Onset   Diabetes Father     Social History Social History   Tobacco Use   Smoking status: Every Day    Packs/day: 0.50    Types: Cigarettes   Smokeless tobacco: Never  Vaping Use   Vaping Use: Never used  Substance Use Topics   Alcohol use:  Yes    Alcohol/week: 18.0 standard drinks    Types: 18 Cans of beer per week    Comment: occasionally   Drug use: No    Review of Systems  Constitutional: No fever/chills Eyes: No visual changes.  ENT: No sore throat. Cardiovascular: Denies chest pain. Respiratory: Some shortness of breath with exertion Gastrointestinal: No abdominal pain.  No nausea, no vomiting.   Genitourinary: Negative for dysuria. Musculoskeletal: As above Skin: Negative for  rash. Neurological: Negative for headaches or weakness   ____________________________________________   PHYSICAL EXAM:  VITAL SIGNS: ED Triage Vitals  Enc Vitals Group     BP 03/22/21 2112 (!) 187/101     Pulse Rate 03/22/21 2112 74     Resp 03/22/21 2112 20     Temp 03/22/21 2112 98.6 F (37 C)     Temp Source 03/22/21 2112 Oral     SpO2 03/22/21 2112 96 %     Weight 03/22/21 2113 97.5 kg (215 lb)     Height 03/22/21 2113 1.803 m (5\' 11" )     Head Circumference --      Peak Flow --      Pain Score --      Pain Loc --      Pain Edu? --      Excl. in GC? --     Constitutional: Alert and oriented.  Nose: No congestion/rhinnorhea. Mouth/Throat: Mucous membranes are moist.    Cardiovascular: Normal rate, regular rhythm. Grossly normal heart sounds.  Good peripheral circulation. Respiratory: Normal respiratory effort.  No retractions. Lungs CTAB. Gastrointestinal: Soft and nontender. No distention.   Genitourinary: deferred Musculoskeletal: 1+ edema bilaterally up to the level of the knee Neurologic:  Normal speech and language. No gross focal neurologic deficits are appreciated.  Skin:  Skin is warm, dry and intact. No rash noted. Psychiatric: Mood and affect are normal. Speech and behavior are normal.  ____________________________________________   LABS (all labs ordered are listed, but only abnormal results are displayed)  Labs Reviewed  BRAIN NATRIURETIC PEPTIDE - Abnormal; Notable for the following components:      Result Value   B Natriuretic Peptide 146.7 (*)    All other components within normal limits  CBC  COMPREHENSIVE METABOLIC PANEL  TROPONIN I (HIGH SENSITIVITY)   ____________________________________________  EKG  ED ECG REPORT I, , the attending physician, personally viewed and interpreted this ECG.  Date: 03/22/2021  Rhythm: normal sinus rhythm QRS Axis: normal Intervals: normal ST/T Wave abnormalities: normal Narrative  Interpretation: no evidence of acute ischemia  ____________________________________________  RADIOLOGY  Chest x-ray viewed by me, no pulmonary edema ____________________________________________   PROCEDURES  Procedure(s) performed: No  Procedures   Critical Care performed: No ____________________________________________   INITIAL IMPRESSION / ASSESSMENT AND PLAN / ED COURSE  Pertinent labs & imaging results that were available during my care of the patient were reviewed by me and considered in my medical decision making (see chart for details).   Patient overall well-appearing and in no acute distress, bilateral lower extremity edema, history of high blood pressure and cardiac disease, suspicious for possible CHF  Oxygen saturations are normal at rest however he is hypertensive.  He does report compliance with his medications  Obtain labs including BNP CBC CMP, chest x-ray  Suspect we will need to start the patient on Lasix while he awaits follow-up with his cardiologist.    ____________________________________________   FINAL CLINICAL IMPRESSION(S) / ED DIAGNOSES  Final diagnoses:  Leg edema  Note:  This document was prepared using Dragon voice recognition software and may include unintentional dictation errors.    Jene Every, MD 03/22/21 2329

## 2021-03-23 ENCOUNTER — Other Ambulatory Visit: Payer: Self-pay

## 2021-04-03 ENCOUNTER — Other Ambulatory Visit: Payer: Self-pay

## 2021-04-03 ENCOUNTER — Other Ambulatory Visit: Payer: Self-pay | Admitting: Cardiology

## 2021-04-03 DIAGNOSIS — I5031 Acute diastolic (congestive) heart failure: Secondary | ICD-10-CM

## 2021-04-03 MED ORDER — METOPROLOL TARTRATE 25 MG PO TABS
25.0000 mg | ORAL_TABLET | Freq: Two times a day (BID) | ORAL | 6 refills | Status: DC
  Filled 2021-04-03: qty 60, 30d supply, fill #0
  Filled 2021-05-28: qty 60, 30d supply, fill #1
  Filled 2021-07-20: qty 60, 30d supply, fill #2
  Filled 2021-08-31: qty 180, 90d supply, fill #3

## 2021-04-03 MED ORDER — ASPIRIN 81 MG PO TBEC
81.0000 mg | DELAYED_RELEASE_TABLET | Freq: Every day | ORAL | 0 refills | Status: DC
  Filled 2021-04-03: qty 30, 30d supply, fill #0
  Filled 2021-05-28: qty 30, 30d supply, fill #1
  Filled 2021-07-20: qty 30, 30d supply, fill #2

## 2021-04-03 MED ORDER — FUROSEMIDE 20 MG PO TABS
ORAL_TABLET | ORAL | 11 refills | Status: AC
  Filled 2021-05-28: qty 30, 30d supply, fill #0
  Filled 2021-07-20: qty 30, 30d supply, fill #1
  Filled 2021-08-31: qty 30, 30d supply, fill #2

## 2021-04-03 MED ORDER — ATORVASTATIN CALCIUM 80 MG PO TABS
ORAL_TABLET | ORAL | 6 refills | Status: DC
  Filled 2021-04-03: qty 30, 30d supply, fill #0
  Filled 2021-05-28: qty 30, 30d supply, fill #1
  Filled 2021-07-20: qty 30, 30d supply, fill #2
  Filled 2021-08-31: qty 90, 90d supply, fill #3
  Filled 2022-01-14: qty 30, 30d supply, fill #0

## 2021-04-03 MED ORDER — LISINOPRIL 5 MG PO TABS
ORAL_TABLET | ORAL | 6 refills | Status: DC
  Filled 2021-04-03: qty 30, 30d supply, fill #0
  Filled 2021-05-28: qty 30, 30d supply, fill #1
  Filled 2021-07-20: qty 30, 30d supply, fill #2
  Filled 2021-08-31: qty 90, 90d supply, fill #3

## 2021-04-03 MED ORDER — CLOPIDOGREL BISULFATE 75 MG PO TABS
ORAL_TABLET | ORAL | 11 refills | Status: AC
  Filled 2021-04-03: qty 30, 30d supply, fill #0
  Filled 2021-05-28: qty 30, 30d supply, fill #1
  Filled 2021-07-20: qty 30, 30d supply, fill #2
  Filled 2021-08-31: qty 90, 90d supply, fill #3
  Filled 2022-01-14: qty 90, 90d supply, fill #0

## 2021-04-10 ENCOUNTER — Ambulatory Visit: Payer: Self-pay

## 2021-04-15 ENCOUNTER — Ambulatory Visit
Admission: RE | Admit: 2021-04-15 | Discharge: 2021-04-15 | Disposition: A | Discharge status: Home / Self Care | Payer: Self-pay | Source: Ambulatory Visit | Attending: Cardiology | Admitting: Cardiology

## 2021-04-15 ENCOUNTER — Other Ambulatory Visit: Payer: Self-pay

## 2021-04-15 DIAGNOSIS — I5031 Acute diastolic (congestive) heart failure: Secondary | ICD-10-CM | POA: Insufficient documentation

## 2021-04-15 DIAGNOSIS — I252 Old myocardial infarction: Secondary | ICD-10-CM | POA: Insufficient documentation

## 2021-04-15 LAB — ECHOCARDIOGRAM COMPLETE
AR max vel: 2.03 cm2
AV Area VTI: 2.3 cm2
AV Area mean vel: 2.01 cm2
AV Mean grad: 3 mmHg
AV Peak grad: 6 mmHg
Ao pk vel: 1.22 m/s
Area-P 1/2: 3.36 cm2
MV VTI: 1.73 cm2
S' Lateral: 2.95 cm

## 2021-04-15 NOTE — Progress Notes (Signed)
*  PRELIMINARY RESULTS* Echocardiogram 2D Echocardiogram has been performed.  Cristela Blue 04/15/2021, 11:36 AM

## 2021-05-28 ENCOUNTER — Other Ambulatory Visit: Payer: Self-pay

## 2021-05-29 ENCOUNTER — Other Ambulatory Visit: Payer: Self-pay

## 2021-07-20 ENCOUNTER — Other Ambulatory Visit: Payer: Self-pay

## 2021-08-31 ENCOUNTER — Other Ambulatory Visit: Payer: Self-pay

## 2021-08-31 MED ORDER — ASPIRIN 81 MG PO TBEC
81.0000 mg | DELAYED_RELEASE_TABLET | Freq: Every day | ORAL | 0 refills | Status: DC
  Filled 2021-08-31: qty 90, 90d supply, fill #0

## 2021-09-16 ENCOUNTER — Other Ambulatory Visit: Payer: Self-pay

## 2021-09-16 MED ORDER — ASPIRIN 81 MG PO CHEW
CHEWABLE_TABLET | ORAL | 3 refills | Status: AC
  Filled 2021-09-16 – 2022-01-19 (×2): qty 90, 90d supply, fill #0

## 2021-09-16 MED ORDER — METOPROLOL SUCCINATE ER 50 MG PO TB24
ORAL_TABLET | Freq: Every day | ORAL | 3 refills | Status: AC
  Filled 2021-09-16 – 2022-01-14 (×2): qty 90, 90d supply, fill #0

## 2021-09-16 MED ORDER — TERBINAFINE HCL 250 MG PO TABS
ORAL_TABLET | Freq: Every day | ORAL | 1 refills | Status: AC
  Filled 2021-09-16 – 2022-01-14 (×2): qty 30, 30d supply, fill #0

## 2021-09-16 MED ORDER — LISINOPRIL 10 MG PO TABS
ORAL_TABLET | ORAL | 3 refills | Status: AC
  Filled 2021-09-16 – 2021-11-11 (×2): qty 90, 90d supply, fill #0
  Filled 2022-01-14: qty 90, 90d supply, fill #1

## 2021-09-18 ENCOUNTER — Other Ambulatory Visit: Payer: Self-pay

## 2021-09-21 ENCOUNTER — Other Ambulatory Visit: Payer: Self-pay

## 2021-10-16 ENCOUNTER — Other Ambulatory Visit: Payer: Self-pay

## 2021-11-11 ENCOUNTER — Other Ambulatory Visit: Payer: Self-pay

## 2022-01-14 ENCOUNTER — Other Ambulatory Visit: Payer: Self-pay

## 2022-01-19 ENCOUNTER — Other Ambulatory Visit: Payer: Self-pay

## 2022-01-19 MED ORDER — ASPIRIN 81 MG PO TBEC
81.0000 mg | DELAYED_RELEASE_TABLET | Freq: Every day | ORAL | 2 refills | Status: DC
  Filled 2022-01-19: qty 90, 90d supply, fill #0

## 2022-03-31 ENCOUNTER — Other Ambulatory Visit: Payer: Self-pay

## 2022-04-05 ENCOUNTER — Other Ambulatory Visit: Payer: Self-pay

## 2022-04-05 MED ORDER — ATORVASTATIN CALCIUM 40 MG PO TABS
80.0000 mg | ORAL_TABLET | Freq: Every day | ORAL | 6 refills | Status: AC
  Filled 2022-04-05 – 2023-03-04 (×3): qty 60, 30d supply, fill #0

## 2022-04-08 ENCOUNTER — Other Ambulatory Visit: Payer: Self-pay

## 2022-04-09 ENCOUNTER — Other Ambulatory Visit: Payer: Self-pay

## 2022-05-13 ENCOUNTER — Other Ambulatory Visit: Payer: Self-pay

## 2022-05-13 MED ORDER — METOPROLOL SUCCINATE ER 50 MG PO TB24
50.0000 mg | ORAL_TABLET | Freq: Every day | ORAL | 3 refills | Status: DC
  Filled 2022-05-13 – 2022-05-31 (×2): qty 90, 90d supply, fill #0
  Filled 2022-10-05: qty 90, 90d supply, fill #1
  Filled 2023-03-04: qty 90, 90d supply, fill #2

## 2022-05-13 MED ORDER — CLOPIDOGREL BISULFATE 75 MG PO TABS
75.0000 mg | ORAL_TABLET | Freq: Every day | ORAL | 1 refills | Status: DC
  Filled 2022-05-13 – 2022-05-31 (×2): qty 90, 90d supply, fill #0
  Filled 2022-10-05: qty 90, 90d supply, fill #1

## 2022-05-13 MED ORDER — ATORVASTATIN CALCIUM 80 MG PO TABS
80.0000 mg | ORAL_TABLET | Freq: Every day | ORAL | 1 refills | Status: DC
  Filled 2022-05-13 – 2022-05-31 (×2): qty 90, 90d supply, fill #0
  Filled 2022-10-05: qty 90, 90d supply, fill #1

## 2022-05-13 MED ORDER — LISINOPRIL 5 MG PO TABS
10.0000 mg | ORAL_TABLET | Freq: Every day | ORAL | 1 refills | Status: DC
  Filled 2022-05-13 – 2022-05-31 (×2): qty 180, 90d supply, fill #0
  Filled 2022-07-26: qty 180, 90d supply, fill #1

## 2022-05-14 ENCOUNTER — Other Ambulatory Visit: Payer: Self-pay

## 2022-05-25 ENCOUNTER — Other Ambulatory Visit: Payer: Self-pay

## 2022-05-31 ENCOUNTER — Other Ambulatory Visit: Payer: Self-pay

## 2022-05-31 MED ORDER — ASPIRIN 81 MG PO CHEW
81.0000 mg | CHEWABLE_TABLET | Freq: Every day | ORAL | 3 refills | Status: AC
  Filled 2022-05-31: qty 90, 90d supply, fill #0

## 2022-06-01 ENCOUNTER — Other Ambulatory Visit: Payer: Self-pay

## 2022-07-26 ENCOUNTER — Other Ambulatory Visit: Payer: Self-pay

## 2022-07-26 MED ORDER — LISINOPRIL 10 MG PO TABS
10.0000 mg | ORAL_TABLET | Freq: Every day | ORAL | 0 refills | Status: DC
  Filled 2022-07-26: qty 90, 90d supply, fill #0

## 2022-08-11 ENCOUNTER — Other Ambulatory Visit (HOSPITAL_COMMUNITY): Payer: Self-pay

## 2022-10-04 ENCOUNTER — Other Ambulatory Visit: Payer: Self-pay

## 2022-10-04 MED ORDER — TERBINAFINE HCL 250 MG PO TABS
250.0000 mg | ORAL_TABLET | Freq: Every day | ORAL | 0 refills | Status: AC
  Filled 2022-10-04: qty 34, 34d supply, fill #0
  Filled 2023-03-04: qty 34, 34d supply, fill #1

## 2022-10-05 ENCOUNTER — Other Ambulatory Visit: Payer: Self-pay

## 2022-10-05 MED ORDER — LISINOPRIL 10 MG PO TABS
10.0000 mg | ORAL_TABLET | Freq: Every day | ORAL | 3 refills | Status: DC
  Filled 2022-10-05: qty 90, 90d supply, fill #0
  Filled 2023-03-04: qty 90, 90d supply, fill #1
  Filled 2023-07-26: qty 90, 90d supply, fill #2

## 2022-10-07 ENCOUNTER — Other Ambulatory Visit: Payer: Self-pay

## 2022-10-27 ENCOUNTER — Other Ambulatory Visit: Payer: Self-pay

## 2022-12-23 ENCOUNTER — Other Ambulatory Visit: Payer: Self-pay

## 2022-12-23 MED ORDER — VARENICLINE TARTRATE (STARTER) 0.5 MG X 11 & 1 MG X 42 PO TBPK
ORAL_TABLET | ORAL | 0 refills | Status: AC
  Filled 2022-12-23: qty 53, 28d supply, fill #0

## 2022-12-30 ENCOUNTER — Other Ambulatory Visit: Payer: Self-pay

## 2023-03-04 ENCOUNTER — Other Ambulatory Visit: Payer: Self-pay

## 2023-03-04 MED ORDER — CLOPIDOGREL BISULFATE 75 MG PO TABS
75.0000 mg | ORAL_TABLET | Freq: Every day | ORAL | 1 refills | Status: AC
  Filled 2023-03-04: qty 90, 90d supply, fill #0
  Filled 2023-04-06 – 2023-07-26 (×2): qty 90, 90d supply, fill #1

## 2023-03-04 MED ORDER — ATORVASTATIN CALCIUM 80 MG PO TABS
80.0000 mg | ORAL_TABLET | Freq: Every day | ORAL | 1 refills | Status: AC
  Filled 2023-03-04: qty 90, 90d supply, fill #0
  Filled 2023-11-03: qty 30, 30d supply, fill #1
  Filled 2023-12-28: qty 30, 30d supply, fill #2

## 2023-03-07 ENCOUNTER — Other Ambulatory Visit: Payer: Self-pay

## 2023-04-06 ENCOUNTER — Other Ambulatory Visit: Payer: Self-pay

## 2023-04-06 MED ORDER — PANTOPRAZOLE SODIUM 40 MG PO TBEC
40.0000 mg | DELAYED_RELEASE_TABLET | Freq: Every day | ORAL | 0 refills | Status: AC
  Filled 2023-04-06: qty 90, 90d supply, fill #0

## 2023-07-26 ENCOUNTER — Other Ambulatory Visit: Payer: Self-pay

## 2023-07-27 ENCOUNTER — Other Ambulatory Visit: Payer: Self-pay

## 2023-07-27 MED ORDER — METOPROLOL SUCCINATE ER 50 MG PO TB24
50.0000 mg | ORAL_TABLET | Freq: Every day | ORAL | 3 refills | Status: AC
  Filled 2023-07-27: qty 90, 90d supply, fill #0
  Filled 2023-11-03: qty 30, 30d supply, fill #1

## 2023-07-27 MED ORDER — DOCUSATE SODIUM 100 MG PO CAPS
100.0000 mg | ORAL_CAPSULE | Freq: Two times a day (BID) | ORAL | 11 refills | Status: AC
  Filled 2023-07-27: qty 60, 30d supply, fill #0

## 2023-07-27 MED ORDER — NA SULFATE-K SULFATE-MG SULF 17.5-3.13-1.6 GM/177ML PO SOLN
ORAL | 0 refills | Status: AC
  Filled 2023-07-27: qty 354, 1d supply, fill #0

## 2023-08-01 ENCOUNTER — Other Ambulatory Visit: Payer: Self-pay

## 2023-08-04 ENCOUNTER — Other Ambulatory Visit: Payer: Self-pay

## 2023-09-02 ENCOUNTER — Other Ambulatory Visit: Payer: Self-pay

## 2023-09-07 ENCOUNTER — Other Ambulatory Visit: Payer: Self-pay

## 2023-09-07 MED ORDER — CHLORHEXIDINE GLUCONATE 0.12 % MT SOLN
15.0000 mL | Freq: Two times a day (BID) | OROMUCOSAL | 0 refills | Status: AC
  Filled 2023-09-07: qty 473, 16d supply, fill #0

## 2023-09-07 MED ORDER — HYDROCODONE-ACETAMINOPHEN 5-325 MG PO TABS
1.0000 | ORAL_TABLET | Freq: Four times a day (QID) | ORAL | 0 refills | Status: AC | PRN
  Filled 2023-09-07: qty 15, 3d supply, fill #0

## 2023-09-13 ENCOUNTER — Encounter: Payer: Self-pay | Admitting: Gastroenterology

## 2023-09-22 ENCOUNTER — Encounter: Payer: Self-pay | Admitting: Gastroenterology

## 2023-09-22 ENCOUNTER — Ambulatory Visit
Admission: RE | Admit: 2023-09-22 | Discharge: 2023-09-22 | Disposition: A | Discharge status: Home / Self Care | Payer: Self-pay | Attending: Gastroenterology | Admitting: Gastroenterology

## 2023-09-22 ENCOUNTER — Ambulatory Visit: Admitting: Anesthesiology

## 2023-09-22 ENCOUNTER — Encounter
Admission: RE | Disposition: A | Discharge status: Home / Self Care | Payer: Self-pay | Source: Home / Self Care | Attending: Gastroenterology

## 2023-09-22 DIAGNOSIS — K2100 Gastro-esophageal reflux disease with esophagitis, without bleeding: Secondary | ICD-10-CM | POA: Diagnosis not present

## 2023-09-22 DIAGNOSIS — I110 Hypertensive heart disease with heart failure: Secondary | ICD-10-CM | POA: Insufficient documentation

## 2023-09-22 DIAGNOSIS — Z79899 Other long term (current) drug therapy: Secondary | ICD-10-CM | POA: Diagnosis not present

## 2023-09-22 DIAGNOSIS — Z7902 Long term (current) use of antithrombotics/antiplatelets: Secondary | ICD-10-CM | POA: Diagnosis not present

## 2023-09-22 DIAGNOSIS — I509 Heart failure, unspecified: Secondary | ICD-10-CM | POA: Insufficient documentation

## 2023-09-22 DIAGNOSIS — Z800 Family history of malignant neoplasm of digestive organs: Secondary | ICD-10-CM | POA: Diagnosis not present

## 2023-09-22 DIAGNOSIS — E66813 Obesity, class 3: Secondary | ICD-10-CM | POA: Insufficient documentation

## 2023-09-22 DIAGNOSIS — K921 Melena: Secondary | ICD-10-CM | POA: Insufficient documentation

## 2023-09-22 DIAGNOSIS — F17210 Nicotine dependence, cigarettes, uncomplicated: Secondary | ICD-10-CM | POA: Insufficient documentation

## 2023-09-22 DIAGNOSIS — Z6829 Body mass index (BMI) 29.0-29.9, adult: Secondary | ICD-10-CM | POA: Insufficient documentation

## 2023-09-22 DIAGNOSIS — K2950 Unspecified chronic gastritis without bleeding: Secondary | ICD-10-CM | POA: Diagnosis not present

## 2023-09-22 DIAGNOSIS — D122 Benign neoplasm of ascending colon: Secondary | ICD-10-CM | POA: Insufficient documentation

## 2023-09-22 DIAGNOSIS — K640 First degree hemorrhoids: Secondary | ICD-10-CM | POA: Diagnosis not present

## 2023-09-22 HISTORY — DX: Heart failure, unspecified: I50.9

## 2023-09-22 HISTORY — PX: ESOPHAGOGASTRODUODENOSCOPY (EGD) WITH PROPOFOL: SHX5813

## 2023-09-22 HISTORY — DX: Essential (primary) hypertension: I10

## 2023-09-22 HISTORY — PX: COLONOSCOPY WITH PROPOFOL: SHX5780

## 2023-09-22 HISTORY — DX: Atherosclerotic heart disease of native coronary artery without angina pectoris: I25.10

## 2023-09-22 SURGERY — COLONOSCOPY WITH PROPOFOL
Anesthesia: General

## 2023-09-22 MED ORDER — DEXMEDETOMIDINE HCL IN NACL 80 MCG/20ML IV SOLN
INTRAVENOUS | Status: DC | PRN
  Administered 2023-09-22: 8 ug via INTRAVENOUS

## 2023-09-22 MED ORDER — GLYCOPYRROLATE 0.2 MG/ML IJ SOLN
INTRAMUSCULAR | Status: DC | PRN
  Administered 2023-09-22: .2 mg via INTRAVENOUS

## 2023-09-22 MED ORDER — PROPOFOL 10 MG/ML IV BOLUS
INTRAVENOUS | Status: DC | PRN
  Administered 2023-09-22: 70 mg via INTRAVENOUS

## 2023-09-22 MED ORDER — PHENYLEPHRINE HCL (PRESSORS) 10 MG/ML IV SOLN
INTRAVENOUS | Status: DC | PRN
  Administered 2023-09-22: 80 ug via INTRAVENOUS
  Administered 2023-09-22: 160 ug via INTRAVENOUS
  Administered 2023-09-22: 80 ug via INTRAVENOUS

## 2023-09-22 MED ORDER — PROPOFOL 500 MG/50ML IV EMUL
INTRAVENOUS | Status: DC | PRN
  Administered 2023-09-22: 140 ug/kg/min via INTRAVENOUS

## 2023-09-22 MED ORDER — SODIUM CHLORIDE 0.9 % IV SOLN: INTRAVENOUS | Status: DC

## 2023-09-22 MED ORDER — LIDOCAINE HCL (CARDIAC) PF 100 MG/5ML IV SOSY
PREFILLED_SYRINGE | INTRAVENOUS | Status: DC | PRN
  Administered 2023-09-22: 60 mg via INTRAVENOUS

## 2023-09-22 MED ORDER — EPHEDRINE SULFATE (PRESSORS) 50 MG/ML IJ SOLN
INTRAMUSCULAR | Status: DC | PRN
  Administered 2023-09-22 (×3): 10 mg via INTRAVENOUS

## 2023-09-22 NOTE — H&P (Signed)
 Pre-Procedure H&P   Patient ID: Matthew Atkinson is a 63 y.o. male.  Gastroenterology Provider: Jaynie Collins, DO  Referring Provider: Tawni Pummel, PA PCP: [facility]  Date: 09/22/2023  HPI Mr. Matthew Atkinson is a 63 y.o. male who presents today for Esophagogastroduodenoscopy and Colonoscopy for Bright red blood per rectum, GERD .  BRBPR 1x episode. No abodminal pain or pain with defecation.  Reports daily to every other day bowel movement  No reflux symptoms, but dentist noted teeth erosion secondary to this.  His teeth have since been removed for dentures. No dysphagia or odynophagia.  No family history of colon cancer.  Brother with history of pancreatic cancer  Plavix has been held for the procedure (last dose 7 days ago)  Colonoscopy in 2019 at [facility] with hyperplastic polyps and internal hemorrhoids   Past Medical History:  Diagnosis Date   CHF (congestive heart failure) (HCC)    Coronary artery disease    Hypertension    MI (myocardial infarction) (HCC)     Past Surgical History:  Procedure Laterality Date   CORONARY STENT INTERVENTION N/A 11/01/2019   Procedure: CORONARY STENT INTERVENTION;  Surgeon: Marcina Millard, MD;  Location: [facility] INVASIVE CV LAB;  Service: Cardiovascular;  Laterality: N/A;   CORONARY/GRAFT ACUTE MI REVASCULARIZATION N/A 11/01/2019   Procedure: Coronary/Graft Acute MI Revascularization;  Surgeon: Marcina Millard, MD;  Location: [facility] INVASIVE CV LAB;  Service: Cardiovascular;  Laterality: N/A;   LEFT HEART CATH AND CORONARY ANGIOGRAPHY N/A 11/01/2019   Procedure: LEFT HEART CATH AND CORONARY ANGIOGRAPHY;  Surgeon: Marcina Millard, MD;  Location: [facility] INVASIVE CV LAB;  Service: Cardiovascular;  Laterality: N/A;    Family History Brother- pancreatic cancer No other h/o GI disease or malignancy  Review of Systems  Constitutional:  Negative for activity change, appetite change, chills,  diaphoresis, fatigue, fever and unexpected weight change.  HENT:  Negative for trouble swallowing and voice change.   Respiratory:  Negative for shortness of breath and wheezing.   Cardiovascular:  Negative for chest pain, palpitations and leg swelling.  Gastrointestinal:  Positive for blood in stool. Negative for abdominal distention, abdominal pain, anal bleeding, constipation, diarrhea, nausea and vomiting.  Musculoskeletal:  Negative for arthralgias and myalgias.  Skin:  Negative for color change and pallor.  Neurological:  Negative for dizziness, syncope and weakness.  Psychiatric/Behavioral:  Negative for confusion. The patient is not nervous/anxious.   All other systems reviewed and are negative.    Medications No current facility-administered medications on file prior to encounter.   Current Outpatient Medications on File Prior to Encounter  Medication Sig Dispense Refill   aspirin 81 MG chewable tablet Chew 1 tablet (81 mg total) by mouth daily.     atorvastatin (LIPITOR) 40 MG tablet Take 2 tablets (80 mg total) by mouth daily. 60 tablet 6   lisinopril (ZESTRIL) 10 MG tablet TAKE 1 TABLET BY MOUTH ONCE DAILY. 90 tablet 3   metoprolol succinate (TOPROL-XL) 50 MG 24 hr tablet TAKE 1 TABLET BY MOUTH ONCE DAILY FOR HIGH BLOOD PRESSURE. 90 tablet 3   pantoprazole (PROTONIX) 40 MG tablet Take 1 tablet (40 mg total) by mouth daily for reflux symptoms 90 tablet 0   aspirin 81 MG chewable tablet TAKE 1 TABLET BY MOUTH ONCE DAILY. 90 tablet 3   aspirin 81 MG chewable tablet Chew 1 tablet (81 mg total) by mouth daily. 90 tablet 3   atorvastatin (LIPITOR) 80 MG tablet Take 1 tablet (80 mg total)  by mouth daily. 30 tablet 0   atorvastatin (LIPITOR) 80 MG tablet Take 1 tablet (80 mg total) by mouth daily. 90 tablet 1   clopidogrel (PLAVIX) 75 MG tablet Take 1 tablet (75 mg total) by mouth once daily 30 tablet 11   clopidogrel (PLAVIX) 75 MG tablet Take 1 tablet (75 mg total) by mouth daily. 90  tablet 1   docusate sodium (COLACE) 100 MG capsule Take 1 capsule (100 mg total) by mouth 2 (two) times daily 60 capsule 11   furosemide (LASIX) 20 MG tablet Take 1 tablet (20 mg total) by mouth once daily (Patient not taking: Reported on 09/22/2023) 30 tablet 11   HYDROcodone-acetaminophen (NORCO) 5-325 MG tablet Take 1 tablet by mouth every 4 (four) hours as needed for moderate pain. 6 tablet 0   lisinopril (ZESTRIL) 10 MG tablet Take 1 tablet (10 mg total) by mouth daily. 90 tablet 3   lisinopril (ZESTRIL) 5 MG tablet Take 1 tablet (5 mg total) by mouth daily. 30 tablet 0   metoprolol succinate (TOPROL-XL) 50 MG 24 hr tablet Take 1 tablet (50 mg total) by mouth daily. 90 tablet 3   metoprolol tartrate (LOPRESSOR) 50 MG tablet Take 1 tablet (50 mg total) by mouth 2 (two) times daily. 60 tablet 0   Na Sulfate-K Sulfate-Mg Sulfate concentrate 17.5-3.13-1.6 GM/177ML SOLN Take 1 Bottle by mouth as directed One kit contains 2 bottles.  Take both bottles at the times instructed by your provider. 354 mL 0   terbinafine (LAMISIL) 250 MG tablet TAKE 1 TABLET BY MOUTH ONCE DAILY FOR NAIL FUNGUS FOR 12 WEEKS. 84 tablet 1   terbinafine (LAMISIL) 250 MG tablet Take 1 tablet (250 mg total) by mouth daily for nail fungus for 12 weeks. 84 tablet 0   Varenicline Tartrate, Starter, 0.5 MG X 11 & 1 MG X 42 TBPK Take by mouth as directed on package. 53 each 0   [DISCONTINUED] hydrochlorothiazide (MICROZIDE) 12.5 MG capsule Take 1 capsule (12.5 mg total) by mouth daily. 30 capsule 0    Pertinent medications related to GI and procedure were reviewed by me with the patient prior to the procedure   Current Facility-Administered Medications:    0.9 %  sodium chloride infusion, , Intravenous, Continuous, Jaynie Collins, DO, Last Rate: 20 mL/hr at 09/22/23 0943, New Bag at 09/22/23 0943  sodium chloride 20 mL/hr at 09/22/23 0943       No Known Allergies Allergies were reviewed by me prior to the  procedure  Objective   Body mass index is 29.68 kg/m. Vitals:   09/22/23 0935  BP: 129/85  Pulse: (!) 48  Resp: 16  Temp: (!) 96.5 F (35.8 C)  TempSrc: Temporal  SpO2: 100%  Weight: 96.5 kg  Height: 5\' 11"  (1.803 m)     Physical Exam Vitals and nursing note reviewed.  Constitutional:      General: He is not in acute distress.    Appearance: Normal appearance. He is not ill-appearing, toxic-appearing or diaphoretic.  HENT:     Head: Normocephalic and atraumatic.     Nose: Nose normal.     Mouth/Throat:     Mouth: Mucous membranes are moist.     Pharynx: Oropharynx is clear.  Eyes:     General: No scleral icterus.    Extraocular Movements: Extraocular movements intact.  Cardiovascular:     Rate and Rhythm: Regular rhythm. Bradycardia present.     Heart sounds: Normal heart sounds. No murmur heard.  No friction rub. No gallop.  Pulmonary:     Effort: Pulmonary effort is normal. No respiratory distress.     Breath sounds: Normal breath sounds. No wheezing, rhonchi or rales.  Abdominal:     General: Bowel sounds are normal. There is no distension.     Palpations: Abdomen is soft.     Tenderness: There is no abdominal tenderness. There is no guarding or rebound.  Musculoskeletal:     Cervical back: Neck supple.     Right lower leg: No edema.     Left lower leg: No edema.  Skin:    General: Skin is warm and dry.     Coloration: Skin is not jaundiced or pale.  Neurological:     General: No focal deficit present.     Mental Status: He is alert and oriented to person, place, and time. Mental status is at baseline.  Psychiatric:        Mood and Affect: Mood normal.        Behavior: Behavior normal.        Thought Content: Thought content normal.        Judgment: Judgment normal.      Assessment:  Mr. Matthew Atkinson is a 63 y.o. male  who presents today for Esophagogastroduodenoscopy and Colonoscopy for Bright red blood per rectum, GERD .  Plan:   Esophagogastroduodenoscopy and Colonoscopy with possible intervention today  Esophagogastroduodenoscopy and Colonoscopy with possible biopsy, control of bleeding, polypectomy, and interventions as necessary has been discussed with the patient/patient representative. Informed consent was obtained from the patient/patient representative after explaining the indication, nature, and risks of the procedure including but not limited to death, bleeding, perforation, missed neoplasm/lesions, cardiorespiratory compromise, and reaction to medications. Opportunity for questions was given and appropriate answers were provided. Patient/patient representative has verbalized understanding is amenable to undergoing the procedure.   Jaynie Collins, DO  [facility]  Portions of the record may have been created with voice recognition software. Occasional wrong-word or 'sound-a-like' substitutions may have occurred due to the inherent limitations of voice recognition software.  Read the chart carefully and recognize, using context, where substitutions may have occurred.

## 2023-09-22 NOTE — Anesthesia Preprocedure Evaluation (Signed)
 Anesthesia Evaluation  Patient identified by MRN, date of birth, ID band Patient awake    Reviewed: Allergy & Precautions, NPO status , Patient's Chart, lab work & pertinent test results  Airway Mallampati: II  TM Distance: >3 FB Neck ROM: Full    Dental  (+) Upper Dentures, Lower Dentures   Pulmonary neg pulmonary ROS, COPD, Current Smoker   Pulmonary exam normal  + decreased breath sounds      Cardiovascular Exercise Tolerance: Good hypertension, Pt. on medications + Cardiac Stents and +CHF  negative cardio ROS Normal cardiovascular exam Rhythm:Regular Rate:Normal     Neuro/Psych negative neurological ROS  negative psych ROS   GI/Hepatic negative GI ROS, Neg liver ROS,,,  Endo/Other  negative endocrine ROS  Class 3 obesity  Renal/GU negative Renal ROS  negative genitourinary   Musculoskeletal   Abdominal  (+) + obese  Peds negative pediatric ROS (+)  Hematology negative hematology ROS (+)   Anesthesia Other Findings Past Medical History: No date: CHF (congestive heart failure) (HCC) No date: Coronary artery disease No date: Hypertension No date: MI (myocardial infarction) (HCC)  Past Surgical History: 11/01/2019: CORONARY STENT INTERVENTION; N/A     Comment:  Procedure: CORONARY STENT INTERVENTION;  Surgeon:               Marcina Millard, MD;  Location: [facility] INVASIVE CV               LAB;  Service: Cardiovascular;  Laterality: N/A; 11/01/2019: CORONARY/GRAFT ACUTE MI REVASCULARIZATION; N/A     Comment:  Procedure: Coronary/Graft Acute MI Revascularization;                Surgeon: Marcina Millard, MD;  Location: [facility]               INVASIVE CV LAB;  Service: Cardiovascular;  Laterality:               N/A; 11/01/2019: LEFT HEART CATH AND CORONARY ANGIOGRAPHY; N/A     Comment:  Procedure: LEFT HEART CATH AND CORONARY ANGIOGRAPHY;                Surgeon: Marcina Millard, MD;  Location: [facility]                INVASIVE CV LAB;  Service: Cardiovascular;  Laterality:               N/A;  BMI    Body Mass Index: 29.68 kg/m      Reproductive/Obstetrics negative OB ROS                             Anesthesia Physical Anesthesia Plan  ASA: 3  Anesthesia Plan: General   Post-op Pain Management:    Induction: Intravenous  PONV Risk Score and Plan: Propofol infusion and TIVA  Airway Management Planned: Natural Airway and Nasal Cannula  Additional Equipment:   Intra-op Plan:   Post-operative Plan:   Informed Consent: I have reviewed the patients History and Physical, chart, labs and discussed the procedure including the risks, benefits and alternatives for the proposed anesthesia with the patient or authorized representative who has indicated his/her understanding and acceptance.     Dental Advisory Given  Plan Discussed with: CRNA and Surgeon  Anesthesia Plan Comments:        Anesthesia Quick Evaluation

## 2023-09-22 NOTE — Op Note (Signed)
 [facility] Gastroenterology Patient Name: Matthew Atkinson Procedure Date: 09/22/2023 9:37 AM MRN: [id number] Account #: [id number] Date of Birth: 1961-03-07 Admit Type: Outpatient Age: 63 Room: [facility] ENDO ROOM 1 Gender: Male Note Status: Finalized Instrument Name: Colonoscope [id number] Procedure:             Colonoscopy Indications:           Hematochezia Providers:             Trenda Moots, DO Referring MD:          Jaynie Collins DO, DO (Referring MD), Cambell j.                         [facility], dr (Referring MD) Medicines:             Monitored Anesthesia Care Complications:         No immediate complications. Estimated blood loss:                         Minimal. Procedure:             Pre-Anesthesia Assessment:                        - Prior to the procedure, a History and Physical was                         performed, and patient medications and allergies were                         reviewed. The patient is competent. The risks and                         benefits of the procedure and the sedation options and                         risks were discussed with the patient. All questions                         were answered and informed consent was obtained.                         Patient identification and proposed procedure were                         verified by the physician, the nurse, the anesthetist                         and the technician in the endoscopy suite. Mental                         Status Examination: alert and oriented. Airway                         Examination: normal oropharyngeal airway and neck                         mobility. Respiratory Examination: clear to  auscultation. CV Examination: RRR, no murmurs, no S3                         or S4. Prophylactic Antibiotics: The patient does not                         require prophylactic antibiotics. Prior                          Anticoagulants: The patient has taken Plavix                         (clopidogrel), last dose was 5 days prior to                         procedure. ASA Grade Assessment: III - A patient with                         severe systemic disease. After reviewing the risks and                         benefits, the patient was deemed in satisfactory                         condition to undergo the procedure. The anesthesia                         plan was to use monitored anesthesia care (MAC).                         Immediately prior to administration of medications,                         the patient was re-assessed for adequacy to receive                         sedatives. The heart rate, respiratory rate, oxygen                         saturations, blood pressure, adequacy of pulmonary                         ventilation, and response to care were monitored                         throughout the procedure. The physical status of the                         patient was re-assessed after the procedure.                        After obtaining informed consent, the colonoscope was                         passed under direct vision. Throughout the procedure,                         the patient's blood pressure, pulse, and oxygen  saturations were monitored continuously. The                         Colonoscope was introduced through the anus and                         advanced to the the terminal ileum, with                         identification of the appendiceal orifice and IC                         valve. The colonoscopy was performed without                         difficulty. The patient tolerated the procedure well.                         The quality of the bowel preparation was evaluated                         using the BBPS [facility] Bowel Preparation Scale) with                         scores of: Right Colon = 2 (minor amount of residual                          staining, small fragments of stool and/or opaque                         liquid, but mucosa seen well), Transverse Colon = 2                         (minor amount of residual staining, small fragments of                         stool and/or opaque liquid, but mucosa seen well) and                         Left Colon = 2 (minor amount of residual staining,                         small fragments of stool and/or opaque liquid, but                         mucosa seen well). The total BBPS score equals 6. The                         quality of the bowel preparation was good. The                         terminal ileum, ileocecal valve, appendiceal orifice,                         and rectum were photographed. Findings:      The perianal and digital rectal examinations were normal. Pertinent       negatives include normal  sphincter tone.      The terminal ileum appeared normal. Estimated blood loss: none.      Retroflexion in the right colon was performed.      Three sessile polyps were found in the ascending colon. The polyps were       1 to 2 mm in size. These polyps were removed with a jumbo cold forceps.       Resection and retrieval were complete. Estimated blood loss was minimal.      Non-bleeding internal hemorrhoids were found during retroflexion. The       hemorrhoids were Grade I (internal hemorrhoids that do not prolapse).       Estimated blood loss: none.      The exam was otherwise without abnormality on direct and retroflexion       views. Impression:            - The examined portion of the ileum was normal.                        - Three 1 to 2 mm polyps in the ascending colon,                         removed with a jumbo cold forceps. Resected and                         retrieved.                        - Non-bleeding internal hemorrhoids.                        - The examination was otherwise normal on direct and                         retroflexion views. Recommendation:         - Patient has a contact number available for                         emergencies. The signs and symptoms of potential                         delayed complications were discussed with the patient.                         Return to normal activities tomorrow. Written                         discharge instructions were provided to the patient.                        - Discharge patient to home.                        - Resume previous diet.                        - Continue present medications.                        - Resume Plavix (clopidogrel) at prior dose tomorrow.  Refer to managing physician for further adjustment of                         therapy.                        - No ibuprofen, naproxen, or other non-steroidal                         anti-inflammatory drugs.                        - Use Protonix (pantoprazole) 40 mg PO BID for 8 weeks.                        - Await pathology results.                        - Repeat colonoscopy for surveillance based on                         pathology results.                        - Return to GI office as previously scheduled.                        - The findings and recommendations were discussed with                         the patient. Procedure Code(s):     --- Professional ---                        [id number], Colonoscopy, flexible; with biopsy, single or                         multiple Diagnosis Code(s):     --- Professional ---                        K64.0, First degree hemorrhoids                        D12.2, Benign neoplasm of ascending colon                        K92.1, Melena (includes Hematochezia) CPT copyright 2022 American Medical Association. All rights reserved. The codes documented in this report are preliminary and upon coder review may  be revised to meet current compliance requirements. Attending Participation:      I personally performed the entire procedure. Elfredia Nevins, DO Jaynie Collins DO, DO 09/22/2023 10:30:50 AM This report has been signed electronically. Number of Addenda: 0 Note Initiated On: 09/22/2023 9:37 AM Scope Withdrawal Time: 0 hours 11 minutes 5 seconds  Total Procedure Duration: 0 hours 14 minutes 10 seconds  Estimated Blood Loss:  Estimated blood loss was minimal.      [facility]

## 2023-09-22 NOTE — Transfer of Care (Signed)
 Immediate Anesthesia Transfer of Care Note  Patient: Jaicob Dia  Procedure(s) Performed: COLONOSCOPY WITH PROPOFOL ESOPHAGOGASTRODUODENOSCOPY (EGD) WITH PROPOFOL  Patient Location: PACU  Anesthesia Type:General  Level of Consciousness: awake, alert , and oriented  Airway & Oxygen Therapy: Patient Spontanous Breathing  Post-op Assessment: Report given to RN and Post -op Vital signs reviewed and stable  Post vital signs: Reviewed and stable  Last Vitals:  Vitals Value Taken Time  BP 116/58 09/22/23 1034  Temp 36 C 09/22/23 1034  Pulse 57 09/22/23 1037  Resp 17 09/22/23 1037  SpO2 99 % 09/22/23 1037  Vitals shown include unfiled device data.  Last Pain:  Vitals:   09/22/23 1034  TempSrc: Temporal  PainSc: 0-No pain         Complications: No notable events documented.

## 2023-09-22 NOTE — Anesthesia Postprocedure Evaluation (Signed)
 Anesthesia Post Note  Patient: Matthew Atkinson  Procedure(s) Performed: COLONOSCOPY WITH PROPOFOL ESOPHAGOGASTRODUODENOSCOPY (EGD) WITH PROPOFOL  Patient location during evaluation: PACU Anesthesia Type: General Level of consciousness: awake Pain management: pain level controlled Vital Signs Assessment: post-procedure vital signs reviewed and stable Respiratory status: nonlabored ventilation and spontaneous breathing Cardiovascular status: stable Anesthetic complications: no   No notable events documented.   Last Vitals:  Vitals:   09/22/23 1044 09/22/23 1050  BP: 97/62 99/60  Pulse: 63 (!) 57  Resp: 19 17  Temp:    SpO2: 98% 100%    Last Pain:  Vitals:   09/22/23 1050  TempSrc:   PainSc: 0-No pain                 VAN STAVEREN,Dane Kopke

## 2023-09-22 NOTE — Op Note (Signed)
 [facility] Gastroenterology Patient Name: Matthew Atkinson Procedure Date: 09/22/2023 9:39 AM MRN: [id number] Account #: [id number] Date of Birth: 1961/03/22 Admit Type: Outpatient Age: 63 Room: [facility] ENDO ROOM 1 Gender: Male Note Status: Finalized Instrument Name: Upper Endoscope [id number] Procedure:             Upper GI endoscopy Indications:           Suspected esophageal reflux Providers:             Trenda Moots, DO Referring MD:          Jaynie Collins DO, DO (Referring MD), Cambell j.                         [facility], dr (Referring MD) Medicines:             Monitored Anesthesia Care Complications:         No immediate complications. Estimated blood loss:                         Minimal. Procedure:             Pre-Anesthesia Assessment:                        - Prior to the procedure, a History and Physical was                         performed, and patient medications and allergies were                         reviewed. The patient is competent. The risks and                         benefits of the procedure and the sedation options and                         risks were discussed with the patient. All questions                         were answered and informed consent was obtained.                         Patient identification and proposed procedure were                         verified by the physician, the nurse, the anesthetist                         and the technician in the endoscopy suite. Mental                         Status Examination: alert and oriented. Airway                         Examination: normal oropharyngeal airway and neck                         mobility. Respiratory Examination: clear to  auscultation. CV Examination: RRR, no murmurs, no S3                         or S4. Prophylactic Antibiotics: The patient does not                         require prophylactic antibiotics. Prior                          Anticoagulants: The patient has taken Plavix                         (clopidogrel), last dose was 5 days prior to                         procedure. ASA Grade Assessment: III - A patient with                         severe systemic disease. After reviewing the risks and                         benefits, the patient was deemed in satisfactory                         condition to undergo the procedure. The anesthesia                         plan was to use monitored anesthesia care (MAC).                         Immediately prior to administration of medications,                         the patient was re-assessed for adequacy to receive                         sedatives. The heart rate, respiratory rate, oxygen                         saturations, blood pressure, adequacy of pulmonary                         ventilation, and response to care were monitored                         throughout the procedure. The physical status of the                         patient was re-assessed after the procedure.                        After obtaining informed consent, the endoscope was                         passed under direct vision. Throughout the procedure,                         the patient's blood pressure, pulse, and oxygen  saturations were monitored continuously. The Endoscope                         was introduced through the mouth, and advanced to the                         third part of duodenum. The upper GI endoscopy was                         accomplished without difficulty. The patient tolerated                         the procedure well. Findings:      The duodenal bulb, first portion of the duodenum, second portion of the       duodenum and third portion of the duodenum were normal. Estimated blood       loss: none.      The Z-line was regular. Estimated blood loss: none.      Esophagogastric landmarks were identified: the  gastroesophageal junction       was found at 40 cm from the incisors.      The examined esophagus was normal. Estimated blood loss: none.      Segmental moderate inflammation characterized by erosions and erythema       was found at the incisura and in the gastric antrum. Biopsies were taken       with a cold forceps for histology. Biopsies were taken with a cold       forceps for Helicobacter pylori testing. Estimated blood loss was       minimal.      The exam of the stomach was otherwise normal. Impression:            - Normal duodenal bulb, first portion of the duodenum,                         second portion of the duodenum and third portion of                         the duodenum.                        - Z-line regular.                        - Esophagogastric landmarks identified.                        - Normal esophagus.                        - Gastritis. Biopsied. Recommendation:        - Patient has a contact number available for                         emergencies. The signs and symptoms of potential                         delayed complications were discussed with the patient.                         Return to  normal activities tomorrow. Written                         discharge instructions were provided to the patient.                        - Discharge patient to home.                        - Resume previous diet.                        - Continue present medications.                        - If not already, start proton pump inhbitor                         (pantoprazole) 40 mg twice a day for 8 weeks.                        - No ibuprofen, naproxen, or other non-steroidal                         anti-inflammatory drugs.                        - Await pathology results.                        - Return to GI clinic as previously scheduled.                        - The findings and recommendations were discussed with                         the patient. Procedure  Code(s):     --- Professional ---                        [phone], Esophagogastroduodenoscopy, flexible,                         transoral; with biopsy, single or multiple Diagnosis Code(s):     --- Professional ---                        K29.70, Gastritis, unspecified, without bleeding CPT copyright 2022 American [employer] Association. All rights reserved. The codes documented in this report are preliminary and upon coder review may  be revised to meet current compliance requirements. Attending Participation:      I personally performed the entire procedure. Elfredia Nevins, DO Jaynie Collins DO, DO 09/22/2023 10:09:36 AM This report has been signed electronically. Number of Addenda: 0 Note Initiated On: 09/22/2023 9:39 AM Estimated Blood Loss:  Estimated blood loss was minimal.      [facility]

## 2023-09-22 NOTE — Interval H&P Note (Signed)
 History and Physical Interval Note: Preprocedure H&P from 09/22/23  was reviewed and there was no interval change after seeing and examining the patient.  Written consent was obtained from the patient after discussion of risks, benefits, and alternatives. Patient has consented to proceed with Esophagogastroduodenoscopy and Colonoscopy with possible intervention   09/22/2023 9:47 AM  Matthew Atkinson  has presented today for surgery, with the diagnosis of K62.5 (ICD-10-CM) - Rectal bleeding K21.9 (ICD-10-CM) - Gastroesophageal reflux disease, unspecified whether esophagitis present.  The various methods of treatment have been discussed with the patient and family. After consideration of risks, benefits and other options for treatment, the patient has consented to  Procedure(s): COLONOSCOPY WITH PROPOFOL (N/A) ESOPHAGOGASTRODUODENOSCOPY (EGD) WITH PROPOFOL (N/A) as a surgical intervention.  The patient's history has been reviewed, patient examined, no change in status, stable for surgery.  I have reviewed the patient's chart and labs.  Questions were answered to the patient's satisfaction.     Jaynie Collins

## 2023-09-23 ENCOUNTER — Encounter: Payer: Self-pay | Admitting: Gastroenterology

## 2023-09-23 LAB — SURGICAL PATHOLOGY

## 2023-11-03 ENCOUNTER — Other Ambulatory Visit: Payer: Self-pay

## 2023-11-03 MED ORDER — LISINOPRIL 10 MG PO TABS
10.0000 mg | ORAL_TABLET | Freq: Every day | ORAL | 3 refills | Status: AC
Start: 2023-11-03 — End: ?
  Filled 2023-11-03: qty 30, 30d supply, fill #0

## 2023-11-08 ENCOUNTER — Other Ambulatory Visit: Payer: Self-pay

## 2023-12-28 ENCOUNTER — Other Ambulatory Visit: Payer: Self-pay

## 2024-02-27 ENCOUNTER — Other Ambulatory Visit: Payer: Self-pay | Admitting: Physician Assistant

## 2024-02-27 DIAGNOSIS — Z136 Encounter for screening for cardiovascular disorders: Secondary | ICD-10-CM

## 2024-02-27 DIAGNOSIS — Z87891 Personal history of nicotine dependence: Secondary | ICD-10-CM

## 2024-03-08 ENCOUNTER — Telehealth: Payer: Self-pay

## 2024-03-08 DIAGNOSIS — F17210 Nicotine dependence, cigarettes, uncomplicated: Secondary | ICD-10-CM

## 2024-03-08 DIAGNOSIS — Z122 Encounter for screening for malignant neoplasm of respiratory organs: Secondary | ICD-10-CM

## 2024-03-08 DIAGNOSIS — Z87891 Personal history of nicotine dependence: Secondary | ICD-10-CM

## 2024-03-08 NOTE — Telephone Encounter (Signed)
 Pt to call back and complete screening.

## 2024-03-08 NOTE — Telephone Encounter (Signed)
 Lung Cancer Screening Narrative/Criteria Questionnaire (Cigarette Smokers Only- No Cigars/Pipes/vapes)   Matthew Atkinson   SDMV:03/14/2024 at 3:30 am Kristen         [birth year]               LDCT: 03/15/2024 at 8:30 am at [facility]    63 y.o.   Phone: [phone]  Lung Screening Narrative (confirm age 58-77 yrs Medicare / 50-80 yrs Private pay insurance)   Insurance information: [facility] Medicare   Referring Provider:[facility]   This screening involves an initial phone call with a team member from our program. It is called a shared decision making visit. The initial meeting is required by  insurance and Medicare to make sure you understand the program. This appointment takes about 15-20 minutes to complete. You will complete the screening scan at your scheduled date/time.  This scan takes about 5-10 minutes to complete. You can eat and drink normally before and after the scan.  Criteria questions for Lung Cancer Screening:   Are you a current or former smoker? Current Age began smoking: 18   If you are a former smoker, what year did you quit smoking? Never quit (within 15 yrs)   To calculate your smoking history, I need an accurate estimate of how many packs of cigarettes you smoked per day and for how many years. (Not just the number of PPD you are now smoking)   Years smoking 45 x Packs per day .5 = Pack years 22.5   (at least 20 pack yrs)   (Make sure they understand that we need to know how much they have smoked in the past, not just the number of PPD they are smoking now)  Do you have a personal history of cancer?  No    Do you have a family history of cancer? Yes  (cancer type and and relative) Brother with pancreatic cancer.   Are you coughing up blood?  No  Have you had unexplained weight loss of 15 lbs or more in the last 6 months? No  It looks like you meet all criteria.  When would be a good time for us  to schedule you for this screening?   Additional  information: N/A

## 2024-03-12 ENCOUNTER — Ambulatory Visit

## 2024-03-14 ENCOUNTER — Encounter

## 2024-03-15 ENCOUNTER — Ambulatory Visit
